# Patient Record
Sex: Female | Born: 1990 | Race: Black or African American | Hispanic: No | Marital: Single | Smoking: Never smoker
Health system: Southern US, Community
[De-identification: ages and names within clinical notes are randomized; demographics above are authoritative.]

## PROBLEM LIST (undated history)

## (undated) ENCOUNTER — Inpatient Hospital Stay (HOSPITAL_COMMUNITY): Payer: Self-pay

## (undated) DIAGNOSIS — R519 Headache, unspecified: Secondary | ICD-10-CM

## (undated) DIAGNOSIS — N83209 Unspecified ovarian cyst, unspecified side: Secondary | ICD-10-CM

## (undated) DIAGNOSIS — N949 Unspecified condition associated with female genital organs and menstrual cycle: Secondary | ICD-10-CM

## (undated) DIAGNOSIS — A749 Chlamydial infection, unspecified: Secondary | ICD-10-CM

## (undated) DIAGNOSIS — R51 Headache: Secondary | ICD-10-CM

## (undated) HISTORY — PX: LIPOSUCTION AUOLOGOUS FAT TRANSFER TO BUTTOCKS: SHX6685

## (undated) HISTORY — PX: DILATION AND CURETTAGE OF UTERUS: SHX78

---
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
## 2011-03-08 ENCOUNTER — Encounter: Payer: Self-pay | Admitting: Obstetrics and Gynecology

## 2011-03-08 ENCOUNTER — Other Ambulatory Visit: Payer: Self-pay

## 2011-03-17 ENCOUNTER — Encounter: Payer: Self-pay | Admitting: Obstetrics and Gynecology

## 2011-03-25 ENCOUNTER — Encounter: Payer: Medicaid Other | Admitting: Obstetrics and Gynecology

## 2011-03-25 ENCOUNTER — Other Ambulatory Visit (INDEPENDENT_AMBULATORY_CARE_PROVIDER_SITE_OTHER): Payer: Medicaid Other

## 2011-03-25 DIAGNOSIS — N949 Unspecified condition associated with female genital organs and menstrual cycle: Secondary | ICD-10-CM

## 2011-03-25 DIAGNOSIS — N920 Excessive and frequent menstruation with regular cycle: Secondary | ICD-10-CM

## 2011-05-17 ENCOUNTER — Telehealth: Payer: Self-pay | Admitting: Obstetrics and Gynecology

## 2011-05-18 ENCOUNTER — Other Ambulatory Visit (INDEPENDENT_AMBULATORY_CARE_PROVIDER_SITE_OTHER): Payer: Medicaid Other

## 2011-05-18 DIAGNOSIS — N912 Amenorrhea, unspecified: Secondary | ICD-10-CM

## 2011-05-18 LAB — POCT URINE PREGNANCY: Preg Test, Ur: NEGATIVE

## 2011-10-24 ENCOUNTER — Inpatient Hospital Stay (HOSPITAL_COMMUNITY)
Admission: AD | Admit: 2011-10-24 | Discharge: 2011-10-24 | Disposition: A | Discharge status: Home / Self Care | Payer: Medicaid Other | Source: Ambulatory Visit | Attending: Family Medicine | Admitting: Family Medicine

## 2011-10-24 ENCOUNTER — Encounter (HOSPITAL_COMMUNITY): Payer: Self-pay | Admitting: Obstetrics and Gynecology

## 2011-10-24 DIAGNOSIS — B373 Candidiasis of vulva and vagina: Secondary | ICD-10-CM

## 2011-10-24 DIAGNOSIS — B3731 Acute candidiasis of vulva and vagina: Secondary | ICD-10-CM | POA: Insufficient documentation

## 2011-10-24 DIAGNOSIS — N949 Unspecified condition associated with female genital organs and menstrual cycle: Secondary | ICD-10-CM | POA: Insufficient documentation

## 2011-10-24 HISTORY — DX: Chlamydial infection, unspecified: A74.9

## 2011-10-24 LAB — WET PREP, GENITAL

## 2011-10-24 MED ORDER — FLUCONAZOLE 150 MG PO TABS: 150.0000 mg | ORAL_TABLET | Freq: Once | ORAL | Status: DC

## 2011-10-24 MED ORDER — FLUCONAZOLE 150 MG PO TABS
150.0000 mg | ORAL_TABLET | Freq: Once | ORAL | Status: AC
  Administered 2011-10-24: 150 mg via ORAL
  Filled 2011-10-24: qty 1

## 2011-10-24 NOTE — MAU Provider Note (Signed)
  History     CSN: [id number]  Arrival date and time: 10/24/11 1027   First Provider Initiated Contact with Patient 10/24/11 1126      No chief complaint on file.  HPI  Pt is not pregnant and was diagnosed  With Chlamydia after being seen at PCP on [address]   She was initially given a prescription for Amoxicillin and when Chlamdyia cultre came back positive, she  was given a prescription for Doxycycline as well  As Flagyl.  She has had some vaginal itching with thick white vaginal discharge.  Past Medical History  Diagnosis Date  . Chlamydia     currently being treated    Past Surgical History  Procedure Date  . No past surgeries     Family History  Problem Relation Age of Onset  . Cancer Maternal Grandmother   . Diabetes Maternal Grandmother   . Heart disease Maternal Grandmother   . Cancer Maternal Grandfather     History  Substance Use Topics  . Smoking status: Never Smoker   . Smokeless tobacco: Not on file  . Alcohol Use: No    Allergies:  Allergies  Allergen Reactions  . Cefaclor Rash    Prescriptions prior to admission  Medication Sig Dispense Refill  . doxycycline (VIBRAMYCIN) 100 MG capsule Take 100 mg by mouth 2 (two) times daily. Patient on day 4 10-24-11 out of 7 days      . metroNIDAZOLE (FLAGYL) 500 MG tablet Take 500 mg by mouth 2 (two) times daily. Patient on day 3 10-24-11 out of 7      . norgestimate-ethinyl estradiol (ORTHO-CYCLEN,SPRINTEC,PREVIFEM) 0.25-35 MG-MCG tablet Take 1 tablet by mouth daily.        ROS Physical Exam   Height 5' 3.75" (1.619 m), weight 72.485 kg (159 lb 12.8 oz), last menstrual period 09/27/2011, not currently breastfeeding.  Physical Exam  Constitutional: She is oriented to person, place, and time. She appears well-developed and well-nourished.  HENT:  Head: Normocephalic.  Eyes: Pupils are equal, round, and reactive to light.  Neck: Normal range of motion. Neck supple.  Cardiovascular: Normal rate.     Respiratory: Effort normal.  GI: She exhibits no distension. There is no tenderness. There is no rebound and no guarding.  Genitourinary:       Vaginal mucosa reddened with small amount of white creamy discharge in vault; cervix clean, NT; uterus retroverted NSSC ,NT; adnexa without palpable enlargement or tenderness  Musculoskeletal: Normal range of motion.  Neurological: She is alert and oriented to person, place, and time.  Skin: Skin is warm and dry.  Psychiatric: She has a normal mood and affect.    MAU Course  Procedures Diflucan given in MAU  Assessment and Plan  Yeast vaginitis- Diflucan prescription to take if still having symptoms in 3 days  Jaena Brocato 10/24/2011, 11:27 AM

## 2011-10-24 NOTE — MAU Note (Signed)
"  I was tested for STIs 2 weeks ago at my doctor's office.  I took an antibiotic for BV and completes that.  My doctor then called me to inform me of (+) Chlamydia.  She called in a Rx for Chlamydia on Wednesday (10/16).  I started having vaginal irritation yesterday.  The irritation is like burning and itching that had me up tossing and turning all night long.  My partner hasn't been treated, because of insurance reasons.  I haven't slept with him since I found out I had it.  He has been my partner for 3 years and I haven't been with anyone besides him."

## 2011-10-25 NOTE — MAU Provider Note (Signed)
Chart reviewed and agree with management and plan.  

## 2011-11-30 ENCOUNTER — Ambulatory Visit (INDEPENDENT_AMBULATORY_CARE_PROVIDER_SITE_OTHER): Payer: Medicaid Other | Admitting: Obstetrics and Gynecology

## 2011-11-30 ENCOUNTER — Encounter: Payer: Self-pay | Admitting: Obstetrics and Gynecology

## 2011-11-30 DIAGNOSIS — R87811 Vaginal high risk human papillomavirus (HPV) DNA test positive: Secondary | ICD-10-CM

## 2011-11-30 DIAGNOSIS — IMO0002 Reserved for concepts with insufficient information to code with codable children: Secondary | ICD-10-CM

## 2011-11-30 DIAGNOSIS — R87619 Unspecified abnormal cytological findings in specimens from cervix uteri: Secondary | ICD-10-CM

## 2011-11-30 LAB — POCT URINE PREGNANCY: Preg Test, Ur: NEGATIVE

## 2011-11-30 NOTE — Progress Notes (Signed)
Here secondary to ASCUS pap with +HRHPV  Filed Vitals:   11/30/11 1440  BP: 98/60   A/P Reviewed pap results Options and recs discussed PT only 21 Will repeat pap in April 2014

## 2012-03-28 ENCOUNTER — Inpatient Hospital Stay (HOSPITAL_COMMUNITY)
Admission: AD | Admit: 2012-03-28 | Discharge: 2012-03-29 | Disposition: A | Discharge status: Home / Self Care | Payer: Medicaid Other | Source: Ambulatory Visit | Attending: Family Medicine | Admitting: Family Medicine

## 2012-03-28 ENCOUNTER — Encounter (HOSPITAL_COMMUNITY): Payer: Self-pay

## 2012-03-28 DIAGNOSIS — K5289 Other specified noninfective gastroenteritis and colitis: Secondary | ICD-10-CM

## 2012-03-28 DIAGNOSIS — R112 Nausea with vomiting, unspecified: Secondary | ICD-10-CM | POA: Insufficient documentation

## 2012-03-28 DIAGNOSIS — R109 Unspecified abdominal pain: Secondary | ICD-10-CM | POA: Insufficient documentation

## 2012-03-28 DIAGNOSIS — A088 Other specified intestinal infections: Secondary | ICD-10-CM | POA: Insufficient documentation

## 2012-03-28 DIAGNOSIS — K529 Noninfective gastroenteritis and colitis, unspecified: Secondary | ICD-10-CM

## 2012-03-28 LAB — URINALYSIS, ROUTINE W REFLEX MICROSCOPIC
Ketones, ur: 40 mg/dL — AB
Leukocytes, UA: NEGATIVE
Nitrite: NEGATIVE
Protein, ur: NEGATIVE mg/dL
pH: 6 (ref 5.0–8.0)

## 2012-03-28 MED ORDER — ONDANSETRON HCL 4 MG/2ML IJ SOLN
4.0000 mg | Freq: Once | INTRAMUSCULAR | Status: AC
  Administered 2012-03-28: 4 mg via INTRAVENOUS
  Filled 2012-03-28: qty 2

## 2012-03-28 MED ORDER — LACTATED RINGERS IV BOLUS (SEPSIS)
1000.0000 mL | Freq: Once | INTRAVENOUS | Status: AC
  Administered 2012-03-28: 1000 mL via INTRAVENOUS

## 2012-03-28 NOTE — MAU Note (Signed)
Pt reports she has had vomiting for the last few days, mid abd pain. lmp 2 weeks ago

## 2012-03-29 MED ORDER — PROMETHAZINE HCL 25 MG PO TABS: 25.0000 mg | ORAL_TABLET | Freq: Four times a day (QID) | ORAL | Status: DC | PRN

## 2012-03-29 MED ORDER — ONDANSETRON HCL 4 MG PO TABS: 4.0000 mg | ORAL_TABLET | Freq: Four times a day (QID) | ORAL | Status: DC

## 2012-03-29 MED ORDER — ONDANSETRON 8 MG PO TBDP
8.0000 mg | ORAL_TABLET | Freq: Once | ORAL | Status: AC
  Administered 2012-03-29: 8 mg via ORAL
  Filled 2012-03-29: qty 1

## 2012-03-29 NOTE — MAU Provider Note (Signed)
Chart reviewed and agree with management and plan.  

## 2012-03-29 NOTE — MAU Provider Note (Signed)
History     CSN: [id number]  Arrival date and time: 03/28/12 2112   First Provider Initiated Contact with Patient 03/28/12 2239      Chief Complaint  Patient presents with  . Emesis  . Abdominal Pain   HPI Ms. Amber Good is a 22 y.o. G1P0101 who present to MAU today with complaint of N/V and abdominal pain since this afternoon. The patient states that she had [employer] last night and today and started to feel nauseous around 4 pm. She is having upper abdominal pain associated with the nausea. She denies fever and has no GYN concerns. LMP 03/14/12.   OB History   Grav Para Term Preterm Abortions TAB SAB Ect Mult Living   1 1  1      1       Past Medical History  Diagnosis Date  . Chlamydia     currently being treated    Past Surgical History  Procedure Laterality Date  . No past surgeries      Family History  Problem Relation Age of Onset  . Cancer Maternal Grandmother   . Diabetes Maternal Grandmother   . Heart disease Maternal Grandmother   . Cancer Maternal Grandfather     History  Substance Use Topics  . Smoking status: Never Smoker   . Smokeless tobacco: Not on file  . Alcohol Use: No    Allergies:  Allergies  Allergen Reactions  . Cefaclor Rash    No prescriptions prior to admission    Review of Systems  Constitutional: Negative for fever and malaise/fatigue.  Gastrointestinal: Positive for nausea, vomiting and abdominal pain. Negative for diarrhea and constipation.  Genitourinary: Negative for dysuria, urgency and frequency.  Musculoskeletal: Negative for back pain.  Neurological: Negative for dizziness.   Physical Exam   Blood pressure 101/50, pulse 97, temperature 98.6 F (37 C), temperature source Oral, resp. rate 18, height 5\' 3"  (1.6 m), weight 163 lb (73.936 kg), last menstrual period 03/14/2012, SpO2 100.00%.  Physical Exam  Constitutional: She is oriented to person, place, and time. She appears well-developed and  well-nourished. No distress.  HENT:  Head: Normocephalic and atraumatic.  Cardiovascular: Normal rate, regular rhythm and normal heart sounds.   Respiratory: Effort normal and breath sounds normal. No respiratory distress.  GI: Soft. Bowel sounds are normal. She exhibits no distension and no mass. There is tenderness (diffuse mild tenderness to palpation). There is no rebound and no guarding.  Neurological: She is alert and oriented to person, place, and time.  Skin: Skin is warm and dry. No erythema.  Psychiatric: She has a normal mood and affect.   Results for orders placed during the hospital encounter of 03/28/12 (from the past 24 hour(s))  URINALYSIS, ROUTINE W REFLEX MICROSCOPIC     Status: Abnormal   Collection Time    03/28/12  9:40 PM      Result Value Range   Color, Urine YELLOW  YELLOW   APPearance CLEAR  CLEAR   Specific Gravity, Urine >1.030 (*) 1.005 - 1.030   pH 6.0  5.0 - 8.0   Glucose, UA NEGATIVE  NEGATIVE mg/dL   Hgb urine dipstick NEGATIVE  NEGATIVE   Bilirubin Urine NEGATIVE  NEGATIVE   Ketones, ur 40 (*) NEGATIVE mg/dL   Protein, ur NEGATIVE  NEGATIVE mg/dL   Urobilinogen, UA 0.2  0.0 - 1.0 mg/dL   Nitrite NEGATIVE  NEGATIVE   Leukocytes, UA NEGATIVE  NEGATIVE  POCT PREGNANCY, URINE  Status: None   Collection Time    03/28/12  9:52 PM      Result Value Range   Preg Test, Ur NEGATIVE  NEGATIVE    MAU Course  Procedures None  MDM Patient actively vomiting upon arrival. UA shows signs of dehydration.  2 L LR IV given with 4 mg Zofran in first bag. Patient reports improvement of symptoms.  Just prior to discharge patient states that she is starting to have some nausea again. ODT Zofran given as patient will likely not be able to fill Rx before tomorrow morning.   Assessment and Plan  A: Viral Gastroenteritis  P: Discharge home Rx for phenergan and zofran sent to patient's pharmacy Encouraged increased PO hydration as tolerated Patient encouraged  to follow-up with PCP if symptoms do not improve Patient may return to MAU as needed or if her symptoms were to change or worsen  Freddi Starr, PA-C  03/29/2012, 3:21 AM

## 2013-07-26 ENCOUNTER — Inpatient Hospital Stay (HOSPITAL_COMMUNITY): Payer: Medicaid Other

## 2013-07-26 ENCOUNTER — Inpatient Hospital Stay (HOSPITAL_COMMUNITY)
Admission: AD | Admit: 2013-07-26 | Discharge: 2013-07-26 | Disposition: A | Discharge status: Home / Self Care | Payer: Medicaid Other | Source: Ambulatory Visit | Attending: Obstetrics & Gynecology | Admitting: Obstetrics & Gynecology

## 2013-07-26 ENCOUNTER — Encounter (HOSPITAL_COMMUNITY): Payer: Self-pay | Admitting: *Deleted

## 2013-07-26 DIAGNOSIS — M549 Dorsalgia, unspecified: Secondary | ICD-10-CM | POA: Insufficient documentation

## 2013-07-26 DIAGNOSIS — O26899 Other specified pregnancy related conditions, unspecified trimester: Secondary | ICD-10-CM

## 2013-07-26 DIAGNOSIS — Z3201 Encounter for pregnancy test, result positive: Secondary | ICD-10-CM | POA: Insufficient documentation

## 2013-07-26 DIAGNOSIS — N912 Amenorrhea, unspecified: Secondary | ICD-10-CM | POA: Insufficient documentation

## 2013-07-26 DIAGNOSIS — R109 Unspecified abdominal pain: Secondary | ICD-10-CM | POA: Diagnosis not present

## 2013-07-26 DIAGNOSIS — O219 Vomiting of pregnancy, unspecified: Secondary | ICD-10-CM

## 2013-07-26 LAB — URINALYSIS, ROUTINE W REFLEX MICROSCOPIC
Bilirubin Urine: NEGATIVE
Glucose, UA: NEGATIVE mg/dL
Hgb urine dipstick: NEGATIVE
Ketones, ur: NEGATIVE mg/dL
Leukocytes, UA: NEGATIVE
Nitrite: NEGATIVE
PROTEIN: NEGATIVE mg/dL
Specific Gravity, Urine: 1.025 (ref 1.005–1.030)
UROBILINOGEN UA: 0.2 mg/dL (ref 0.0–1.0)
pH: 6 (ref 5.0–8.0)

## 2013-07-26 LAB — CBC
HEMATOCRIT: 38.5 % (ref 36.0–46.0)
Hemoglobin: 13.4 g/dL (ref 12.0–15.0)
MCH: 31 pg (ref 26.0–34.0)
MCHC: 34.8 g/dL (ref 30.0–36.0)
MCV: 89.1 fL (ref 78.0–100.0)
Platelets: 181 10*3/uL (ref 150–400)
RBC: 4.32 MIL/uL (ref 3.87–5.11)
RDW: 12.7 % (ref 11.5–15.5)
WBC: 5.5 10*3/uL (ref 4.0–10.5)

## 2013-07-26 LAB — POCT PREGNANCY, URINE: Preg Test, Ur: POSITIVE — AB

## 2013-07-26 LAB — WET PREP, GENITAL
Clue Cells Wet Prep HPF POC: NONE SEEN
Trich, Wet Prep: NONE SEEN
Yeast Wet Prep HPF POC: NONE SEEN

## 2013-07-26 LAB — HCG, QUANTITATIVE, PREGNANCY: hCG, Beta Chain, Quant, S: 36 m[IU]/mL — ABNORMAL HIGH (ref <5)

## 2013-07-26 MED ORDER — METOCLOPRAMIDE HCL 10 MG PO TABS: 10.0000 mg | ORAL_TABLET | Freq: Three times a day (TID) | ORAL | Status: DC

## 2013-07-26 NOTE — MAU Note (Signed)
Chief Complaint: Possible Pregnancy, Abdominal Pain, Back Pain and Nausea    SUBJECTIVE HPI: Amber Good is a 23 y.o. G2P0101 at [redacted]w[redacted]d by LMP who presents with diffuse moderate intensity lower abdominal pain with associated low back pain and nausea x 2 weeks. Initially pain was mild and intermittent presenting like her typical menstrual cramping but incrementally progressed to moderate pain with associated back pain and persistent nausea. Patient endorses a regular, consistent cycle and became concerned when menstruation did not occur in the context of her current symptoms. She is sexually active with one female  partner and does not use contraception but states that pregnancy is unintended at this time. Patient notes 1 positive home pregnancy test that was follwed by 4 negative tests. Patient was unable to sleep last night due to pain which prompted her visit today.   Patient denies; fever, chills, vomiting, dysuria, vaginal discomfort. PMH is negative for ovarian cysts, ovarian torsion, nephrolithiasis, PID, untreated STI's, prior ectopic pregnancy, abdominal surgery.   Past Medical History  Diagnosis Date  . Chlamydia     Recently resolved   OB History  Gravida Para Term Preterm AB SAB TAB Ectopic Multiple Living  2 1  1      1     # Outcome Date GA Lbr Len/2nd Weight Sex Delivery Anes PTL Lv  2 CUR           1 PRE 10/11/10 [redacted]w[redacted]d 08:26 / 00:10 3 lb 5 oz (1.501 kg) M SVD EPI  Y     Past Surgical History  Procedure Laterality Date  . No past surgeries     History   Social History  . Marital Status: Single    Spouse Name: N/A    Number of Children: N/A  . Years of Education: N/A   Occupational History  . Not on file.   Social History Main Topics  . Smoking status: Never Smoker   . Smokeless tobacco: Never Used  . Alcohol Use: No  . Drug Use: No  . Sexual Activity: Yes    Birth Control/ Protection: None   Other Topics Concern  . Not on file   Social History  Narrative  . No narrative on file   No current facility-administered medications on file prior to encounter.   No current outpatient prescriptions on file prior to encounter.   Allergies  Allergen Reactions  . Cefaclor Rash    ROS: Pertinent items in HPI  OBJECTIVE Blood pressure 109/58, pulse 63, temperature 99.2 F (37.3 C), temperature source Oral, resp. rate 18, height 5\' 4"  (1.626 m), weight 175 lb 3.2 oz (79.47 kg), last menstrual period 06/19/2013, SpO2 100.00%. GENERAL: Well-developed, well-nourished female in no acute distress.  HEENT: Normocephalic, atraumatic HEART: normal rate and rhythm, no murmurs, rubs, gallops RESP: normal effort, clear to auscultation bilateral. ABDOMEN: Soft, non-tender, normoactive bowel sounds EXTREMITIES: Nontender, no edema, 2+ equal distal pulses NEURO: Alert and oriented SPECULUM EXAM: NEFG, physiologic discharge, no blood noted, cervix clean BIMANUAL: cervix ; uterus normal size, no adnexal tenderness or masses  LAB RESULTS    URINALYSIS, ROUTINE W REFLEX MICROSCOPIC     Status: None   Collection Time    07/26/13 10:55 AM      Result Value Ref Range   Color, Urine YELLOW  YELLOW   APPearance CLEAR  CLEAR   Specific Gravity, Urine 1.025  1.005 - 1.030   pH 6.0  5.0 - 8.0   Glucose, UA NEGATIVE  NEGATIVE mg/dL  Hgb urine dipstick NEGATIVE  NEGATIVE   Bilirubin Urine NEGATIVE  NEGATIVE   Ketones, ur NEGATIVE  NEGATIVE mg/dL   Protein, ur NEGATIVE  NEGATIVE mg/dL   Urobilinogen, UA 0.2  0.0 - 1.0 mg/dL   Nitrite NEGATIVE  NEGATIVE   Leukocytes, UA NEGATIVE  NEGATIVE   Comment: MICROSCOPIC NOT DONE ON URINES WITH NEGATIVE PROTEIN, BLOOD, LEUKOCYTES, NITRITE, OR GLUCOSE <1000 mg/dL.  POCT PREGNANCY, URINE     Status: Abnormal   Collection Time    07/26/13 11:04 AM      Result Value Ref Range   Preg Test, Ur POSITIVE (*) NEGATIVE   Comment:            THE SENSITIVITY OF THIS     METHODOLOGY IS >24 mIU/mL  CBC     Status: None    Collection Time    07/26/13 12:34 PM      Result Value Ref Range   WBC 5.5  4.0 - 10.5 K/uL   RBC 4.32  3.87 - 5.11 MIL/uL   Hemoglobin 13.4  12.0 - 15.0 g/dL   HCT 91.4  78.2 - 95.6 %   MCV 89.1  78.0 - 100.0 fL   MCH 31.0  26.0 - 34.0 pg   MCHC 34.8  30.0 - 36.0 g/dL   RDW 21.3  08.6 - 57.8 %   Platelets 181  150 - 400 K/uL  HCG, QUANTITATIVE, PREGNANCY     Status: Abnormal   Collection Time    07/26/13 12:35 PM      Result Value Ref Range   hCG, Beta Chain, Quant, S 36 (*) <5 mIU/mL   Comment:              GEST. AGE      CONC.  (mIU/mL)       <=1 WEEK        5 - 50         2 WEEKS       50 - 500         3 WEEKS       100 - 10,000         4 WEEKS     1,000 - 30,000         5 WEEKS     3,500 - 115,000       6-8 WEEKS     12,000 - 270,000        12 WEEKS     15,000 - 220,000                FEMALE AND NON-PREGNANT FEMALE:         LESS THAN 5 mIU/mL  WET PREP, GENITAL     Status: Abnormal   Collection Time    07/26/13  2:00 PM      Result Value Ref Range   Yeast Wet Prep HPF POC NONE SEEN  NONE SEEN   Trich, Wet Prep NONE SEEN  NONE SEEN   Clue Cells Wet Prep HPF POC NONE SEEN  NONE SEEN   WBC, Wet Prep HPF POC FEW (*) NONE SEEN   Comment: FEW BACTERIA SEEN   IMAGING  CLINICAL DATA: Pelvic and back pain. Gestational age by LMP of 5  weeks 2 days.  EXAM:  OBSTETRIC <14 WK [place] AND TRANSVAGINAL OB US  TECHNIQUE:  Both transabdominal and transvaginal ultrasound examinations were  performed for complete evaluation of the gestation as well as the  maternal uterus, adnexal regions,  and pelvic cul-de-sac.  Transvaginal technique was performed to assess early pregnancy.  COMPARISON: None.  FINDINGS:  No intrauterine gestational sac visualized in endometrial cavity.  Endometrial thickness measures approximately 19 mm transvaginally.  No fibroids identified.  Both ovaries are normal in appearance. No adnexal mass or free fluid  identified.  IMPRESSION:  No IUP or adnexal  mass visualized. Differential diagnosis includes  recent spontaneous abortion, IUP too early to visualize, and occult  ectopic pregnancy. Recommend close follow up of quantitative B-HCG  levels, and follow up US as clinically warranted.     MAU COURSE  ASSESSMENT Possible IUP vs Ectopy  Unable to confirm with transvaginal US and b-HCG   PLAN RX Reglan 10 mg TID with meals F/U with provider for repeat b-HCG in 48hrs (Saturday) Ectopic precautions reviewed.  Lyna PoserJeremy M Kino SpringsNicoloff, Student-PA 07/26/2013  2:03 PM  I examined pt and agree with documentation above and PA-S plan of care. Eino FarberWalidah Kennith GainN Karim, CNM

## 2013-07-26 NOTE — MAU Note (Signed)
Patient states she has had 5 pregnancy test at home, the first about one week ago was positive, then 4 negative. Has missed her period. Has some nausea, no vomiting. Having abdominal cramping and back pain.

## 2013-07-26 NOTE — Discharge Instructions (Signed)

## 2013-07-26 NOTE — MAU Note (Signed)
Pt states that her last period was June 16th, has taken pregnancy 5 tests of which one was positive. She has constant back and stomach pain. Pt has nausea but denies vomiting.

## 2013-07-27 LAB — GC/CHLAMYDIA PROBE AMP
CT PROBE, AMP APTIMA: NEGATIVE
GC PROBE AMP APTIMA: NEGATIVE

## 2013-08-11 ENCOUNTER — Encounter (HOSPITAL_COMMUNITY): Payer: Self-pay | Admitting: *Deleted

## 2013-08-11 ENCOUNTER — Inpatient Hospital Stay (HOSPITAL_COMMUNITY): Payer: Medicaid Other

## 2013-08-11 ENCOUNTER — Inpatient Hospital Stay (HOSPITAL_COMMUNITY)
Admission: AD | Admit: 2013-08-11 | Discharge: 2013-08-11 | Disposition: A | Discharge status: Home / Self Care | Payer: Medicaid Other | Source: Ambulatory Visit | Attending: Obstetrics and Gynecology | Admitting: Obstetrics and Gynecology

## 2013-08-11 DIAGNOSIS — O209 Hemorrhage in early pregnancy, unspecified: Secondary | ICD-10-CM | POA: Diagnosis present

## 2013-08-11 LAB — CBC
HCT: 37.2 % (ref 36.0–46.0)
Hemoglobin: 12.8 g/dL (ref 12.0–15.0)
MCH: 30.5 pg (ref 26.0–34.0)
MCHC: 34.4 g/dL (ref 30.0–36.0)
MCV: 88.8 fL (ref 78.0–100.0)
PLATELETS: 195 10*3/uL (ref 150–400)
RBC: 4.19 MIL/uL (ref 3.87–5.11)
RDW: 12.8 % (ref 11.5–15.5)
WBC: 5.9 10*3/uL (ref 4.0–10.5)

## 2013-08-11 LAB — HCG, QUANTITATIVE, PREGNANCY: HCG, BETA CHAIN, QUANT, S: 9097 m[IU]/mL — AB (ref <5)

## 2013-08-11 NOTE — MAU Note (Signed)
Pt states here for bleeding. Has had bleeding however this am woke up and noted dark red blood, no clots. Was told to come to MAU for further eval.

## 2013-08-11 NOTE — MAU Provider Note (Signed)
History    23 yo G2P0010 at approx 7 weeks by LMP presented after calling with small amount of bleeding this am.  Denies pain or recent IC.  Seen in MAU on 07/26/13 for abdominal pain, nausea, ? Pregnancy.  QHCG 36, with negative cultures.  Has a single female partner.  Patient Active Problem List   Diagnosis Date Noted  . 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 Preterm delivery 10/13/2010  . Preterm labor 10/11/2010  . Back pain complicating pregnancy 10/06/2010  Delivered at 32 weeks in 2012.  Chief Complaint  Patient presents with  . Vaginal Bleeding   HPI:  As above  OB History   Grav Para Term Preterm Abortions TAB SAB Ect Mult Living   2 1  1      1       Past Medical History  Diagnosis Date  . Preterm labor   . Chlamydia     Past Surgical History  Procedure Laterality Date  . No past surgeries      Family History  Problem Relation Age of Onset  . Cancer Maternal Grandmother   . Diabetes Maternal Grandmother   . Heart disease Maternal Grandmother   . Cancer Maternal Grandfather     History  Substance Use Topics  . Smoking status: Never Smoker   . Smokeless tobacco: Never Used  . Alcohol Use: No    Allergies:  Allergies  Allergen Reactions  . Cefaclor Rash    Prescriptions prior to admission  Medication Sig Dispense Refill  . metoCLOPramide (REGLAN) 10 MG tablet Take 1 tablet (10 mg total) by mouth 3 (three) times daily with meals.  90 tablet  1    ROS:  Vaginal bleeding. Physical Exam   Blood pressure 113/71, pulse 76, temperature 97.9 F (36.6 C), temperature source Oral, resp. rate 18, height 5' 3.75" (1.619 m), weight 174 lb 8 oz (79.153 kg), last menstrual period 06/19/2013.  Physical Exam In NAD Chest clear Heart RRR without murmur Abdomen soft, NT Pelvic--small amount dark blood in vault, cervix closed, long, NT Ext WNL    ED Course  Assessment: 1st trimester bleeding B+  Plan: QHCG, CBC US prn based on QHCG  level Reviewed risk of SAB with patient and mother.   Nigel BridgemanLATHAM, Amber Good CNM, MSN 08/11/2013 7:58 AM   Addendum:  Returned from US: IMPRESSION:  Early gestation now visualized in the uterus. Estimated gestational  age by ultrasound is 5 weeks and 4 days. Fetal cardiac activity is  not yet detectable by ultrasound. Irregular shaped of the  gestational sac present which should be followed.  Results for orders placed during the hospital encounter of 08/11/13 (from the past 24 hour(s))  HCG, QUANTITATIVE, PREGNANCY     Status: Abnormal   Collection Time    08/11/13  7:40 AM      Result Value Ref Range   hCG, Beta Chain, Quant, S 9097 (*) <5 mIU/mL  CBC     Status: None   Collection Time    08/11/13  7:40 AM      Result Value Ref Range   WBC 5.9  4.0 - 10.5 K/uL   RBC 4.19  3.87 - 5.11 MIL/uL   Hemoglobin 12.8  12.0 - 15.0 g/dL   HCT 62.137.2  30.836.0 - 65.746.0 %   MCV 88.8  78.0 - 100.0 fL   MCH 30.5  26.0 - 34.0 pg   MCHC 34.4  30.0 - 36.0 g/dL   RDW 12.8  11.5 - 15.5 %   Platelets 195  150 - 400 K/uL   Reviewed QHCG and US findings with patient--discussed threatened miscarriage dx. Will repeat QHCG on Monday to determine if going down. Note to office for arrangements. Consider [place] later this week if values have plateaued or risen. Bleeding precautions reviewed. Note for work today, if patient elects not to go.  Nigel Bridgeman, CNM 08/13/13 9:50am

## 2013-08-11 NOTE — Discharge Instructions (Signed)

## 2013-09-11 ENCOUNTER — Other Ambulatory Visit (HOSPITAL_COMMUNITY): Payer: Self-pay | Admitting: Family Medicine

## 2013-09-11 ENCOUNTER — Inpatient Hospital Stay (HOSPITAL_COMMUNITY): Payer: Medicaid Other

## 2013-09-11 ENCOUNTER — Inpatient Hospital Stay (HOSPITAL_COMMUNITY)
Admission: AD | Admit: 2013-09-11 | Discharge: 2013-09-11 | Disposition: A | Discharge status: Home / Self Care | Payer: Medicaid Other | Source: Ambulatory Visit | Attending: Obstetrics and Gynecology | Admitting: Obstetrics and Gynecology

## 2013-09-11 ENCOUNTER — Encounter (HOSPITAL_COMMUNITY): Payer: Self-pay | Admitting: *Deleted

## 2013-09-11 DIAGNOSIS — O200 Threatened abortion: Secondary | ICD-10-CM | POA: Insufficient documentation

## 2013-09-11 DIAGNOSIS — IMO0002 Reserved for concepts with insufficient information to code with codable children: Secondary | ICD-10-CM

## 2013-09-11 DIAGNOSIS — O3680X1 Pregnancy with inconclusive fetal viability, fetus 1: Secondary | ICD-10-CM

## 2013-09-11 DIAGNOSIS — O209 Hemorrhage in early pregnancy, unspecified: Secondary | ICD-10-CM | POA: Diagnosis present

## 2013-09-11 LAB — CBC
HCT: 38.3 % (ref 36.0–46.0)
Hemoglobin: 13.4 g/dL (ref 12.0–15.0)
MCH: 30.8 pg (ref 26.0–34.0)
MCHC: 35 g/dL (ref 30.0–36.0)
MCV: 88 fL (ref 78.0–100.0)
Platelets: 204 K/uL (ref 150–400)
RBC: 4.35 MIL/uL (ref 3.87–5.11)
RDW: 12.4 % (ref 11.5–15.5)
WBC: 6.2 K/uL (ref 4.0–10.5)

## 2013-09-11 LAB — HCG, QUANTITATIVE, PREGNANCY: hCG, Beta Chain, Quant, S: 20102 m[IU]/mL — ABNORMAL HIGH (ref <5)

## 2013-09-11 NOTE — Discharge Instructions (Signed)
Intrauterine Fetal Demise °About one percent of normal, uncomplicated pregnancies end in fetal death (intrauterine fetal demise, IUFD). It is considered a fetal death when it occurs after the 20th week of pregnancy. It is considered a miscarriage when a fetal death occurs in the first 20 weeks. The mother's health is usually not in danger. Usually, there is nothing that can be done to prevent it. °CAUSES °· Often the cause is unknown. °· Examination of the stillborn fetus after delivery may show an abnormality in the umbilical cord. An exam my also show a problem with the placenta or fetus. These problems may include infections or a variety of birth defects and genetic disorders. °· The pregnancy continues for 42 weeks or later (post term pregnancy). °· Conditions in the mother such as diabetes, high blood pressure, and numerous other medical, physical or poor lifestyle choices (illegal drugs, alcohol, smoking) increase the risk for fetal death. Often, however, risk factors are unknown. °· Multiple pregnancies (twins or more) increase the risk of fetal death. °SYMPTOMS  °· The mother may not notice symptoms in the early stages of pregnancy. Learning what is wrong (diagnosis) is based on: °¨ The loss of baby's heart sounds. °¨ The lack of increasing belly (abdominal) growth. °¨ Ultrasound studies which suggest death of the fetus. °· In later stages of pregnancy, a woman may be aware of changes in the fetal movement (kicks), or that the movement has stopped. °RELATED COMPLICATIONS °· Disseminated intravascular coagulation (DIC) is a problem with blood clotting. This can result in severe bleeding and rarely develops late after fetal death. °· Infection of the products of the pregnancy (fetal materials). °· Increase bleeding from retained fetal parts or placenta. °TREATMENT  °· Treatment should be accomplished within 2 weeks of the discovered fetal death. °· To confirm the fetal death, diagnostic tests are done such  as: °¨ X-rays. °¨ Ultrasound. °¨ Amniotic fluid studies (looking at the fluid in the sac surrounding the baby). °· Most women, on learning that their fetus is dead, prefer early removal of the contents of the womb (uterus). In the first three months of pregnancy (first trimester), this is usually done by D and C or with suction curettage. Suction curettage is a technique used to remove the dead fetus and other tissue of the pregnancy from the uterus. It uses an instrument somewhat like a straw, connected by tubing to a machine, that your caregiver uses to suck out the dead contents of the uterus. NOTE: Suction curettage may be done in the second and third trimester after delivery of the dead fetus only to make sure there is no placental tissue left in the uterus but not to suction out the fetus. °· In the second trimester, treatment is more frequently accomplished with high doses of a drug, prostaglandin E (Prostin) suppositories or in combination with laminaria (as specialized seaweed product that absorbs moisture and expands to gradually stretch and open the cervix). Prostin (T) causes labor to start. °· In the third trimester, it may be accomplished with laminaria and misoprostol vaginal suppositories to induce labor. It may also be done with the drugs intravenous oxytocin plus prostaglandin E. °· If there was an infection involved with the fetal death, you will be given an antibiotic. You will be given Rho-gam if you are Rh negative and the baby is Rh positive (a vaccine to prevent Rh problems with a future pregnancy). An additional treatment option is to wait for spontaneous labor, which usually occurs within 2   weeks, but may be longer. This is called expectant therapy. °· Following removal of the products of the pregnancy, the stillborn fetus is usually examined by a specialist (pathologist) to determine if problems are present that may reoccur in another pregnancy. This can help plan future pregnancies. That  planning will also include treatment which will best guarantee a good outcome in future pregnancies. °· Your caregiver can also help you deal with feelings of loss, guilt, loneliness, anxiety, and hostility. Family and friends can be helpful. If severe grief lasts longer than several months, professional counseling may be helpful. Joining a grief support group may be useful. °· Any medicines prescribed will depend on the type of treatment received. °Other problems can be cared for with your caregivers. There may be discussions on whether or not to see, touch or photograph the infant, whether to name the infant, what to do with the remains (burial or cremation), and holding religious services. °HOME CARE INSTRUCTIONS  °· Restrictions are usually not necessary unless associated with the delivery choice. °· Sexual intercourse should be avoided for 4 to 6 weeks. Starting another pregnancy should be delayed several months, or as suggested by your caregiver. °· Do not use tampons or douche. °· Only take over-the-counter or prescription medicines for pain, discomfort, or fever as directed by your caregiver. Do not take aspirin it can cause you to bleed. Call your caregiver for a prescription for stronger pain medication if you need it. °· No special diet is necessary unless you have diabetes or other medical problems that require a special diet. °· Take showers instead of baths until your caregiver tells you it is okay. °· Ask your caregiver when you can return to driving and to your everyday activities. °· Make an appointment with your caregiver for follow up care. °PREVENTION  °· Eliminate any of the causes, if possible, that were found after evaluating the fetus. °· Control any medical problems you may have before or during the pregnancy. °· Avoid illegal drugs, alcohol and smoking. °· Maintain good prenatal care and follow your caregiver's treatment and advice. °· Report any concerns or unusual changes you notice  during your pregnancy. °· More frequent prenatal visits may be necessary with the next child. °SEEK MEDICAL CARE IF:  °· You develop abnormal vaginal discharge. °· You develop a temperature 102° F (38.9° C) or higher. °· You are getting dizzy and faint. °· You are feeling depressed. °SEEK IMMEDIATE MEDICAL CARE IF:  °During pregnancy: °· You fail to gain weight, or your abdomen is not increasing in size. °· Your unborn child appears to have less movement or stopped moving. Keep your medical conditions under control. °After delivery: °· You have heavy vaginal bleeding. °· You have chills and fever. °· You have chest pain. °· You have shortness of breath. °· You have pain or swelling or redness of your leg. °· Following the death of a fetus, you or a family member need help or emotional support in coping with the grief process. °MAKE SURE YOU:  °· Understand these instructions. °· Will watch your condition. °· Will get help right away if you are not doing well or get worse. °Document Released: 12/21/2004 Document Revised: 03/15/2011 Document Reviewed: 09/15/2006 °[employer]® Patient Information ©2015 [employer]. This information is not intended to replace advice given to you by your health care provider. Make sure you discuss any questions you have with your health care provider. ° °

## 2013-09-11 NOTE — Progress Notes (Signed)
Results for orders placed during the hospital encounter of 09/11/13 (from the past 24 hour(s))  CBC     Status: None   Collection Time    09/11/13  1:10 PM      Result Value Ref Range   WBC 6.2  4.0 - 10.5 K/uL   RBC 4.35  3.87 - 5.11 MIL/uL   Hemoglobin 13.4  12.0 - 15.0 g/dL   HCT 16.1  09.6 - 04.5 %   MCV 88.0  78.0 - 100.0 fL   MCH 30.8  26.0 - 34.0 pg   MCHC 35.0  30.0 - 36.0 g/dL   RDW 40.9  81.1 - 91.4 %   Platelets 204  150 - 400 K/uL  HCG, QUANTITATIVE, PREGNANCY     Status: Abnormal   Collection Time    09/11/13  1:10 PM      Result Value Ref Range   hCG, Beta Chain, Quant, S 20102 (*) <5 mIU/mL    [place] - Intrauterine gestation at approximately 8 weeks 4 days without  evidence of fetal heart tones. Findings meet definitive criteria for  failed pregnancy.   Consulted with Dr. Estanislado Pandy  [place] results reviewed with patient and she appropriately upset. Pt was given the option of expectant management, active management with cytotec or D&E Pt has requested to review her options with her family before making a decision.  Pt DC'd to home with bleeding precautions and instructed to call the office tomorrow with her decision.      Anavictoria Wilk, CNM, MSN 09/11/2013. 2:32 PM

## 2013-09-11 NOTE — MAU Provider Note (Signed)
Amber Good is a 23 y.o. G2P1 at 10.0 weeks per [facility] records, presents to MAU from the office vaginal bleeding.  She report spotting since Saturday which increase to bleeding today.  BV + today, treated.  GC/CL neg 08/08/13. [place] 08/18/13 r/o sub chronic hemorrhage, FHR 109.  Quant on 08/13/13 14391.8.   History     Patient Active Problem List   Diagnosis Date Noted  . 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 Preterm delivery 10/13/2010  . Preterm labor 10/11/2010  . Back pain complicating pregnancy 10/06/2010    No chief complaint on file.  HPI  OB History   Grav Para Term Preterm Abortions TAB SAB Ect Mult Living   Past Medical History  Diagnosis Date  . Preterm labor     Past Surgical History  Procedure Laterality Date  . No past surgeries      Family History  Problem Relation Age of Onset  . Cancer Maternal Grandmother   . Diabetes Maternal Grandmother   . Heart disease Maternal Grandmother   . Cancer Maternal Grandfather     History  Substance Use Topics  . Smoking status: Never Smoker   . Smokeless tobacco: Never Used  . Alcohol Use: No    Allergies:  Allergies  Allergen Reactions  . Cefaclor Rash    No prescriptions prior to admission    ROS See HPI above, all other systems are negative  Physical Exam   Last menstrual period 06/19/2013.  Ext:  WNL ABD: Soft, non tender to palpation, no rebound or guarding SVE: 0/0/-4 in office today   ED Course  Assessment: IUP at  10.0 weeks 1st trimester bleed Threaten abortion  Plan: Labs: CBC, BHCG [place] for viability Consult with Dr. Francisca December Amber Good, CNM, MSN 09/11/2013. 12:57 PM

## 2013-09-11 NOTE — MAU Note (Signed)
Pt sent to MAU from MD office for U/S, is 10 weeks, unable to hear FHT's in office - has been bleeding since Sunday.  States she has been changing 2 panti liners a day.  Denies pain.

## 2013-09-12 ENCOUNTER — Encounter (HOSPITAL_COMMUNITY): Payer: Self-pay | Admitting: *Deleted

## 2013-09-14 ENCOUNTER — Encounter (HOSPITAL_COMMUNITY): Payer: Self-pay | Admitting: Anesthesiology

## 2013-09-14 ENCOUNTER — Ambulatory Visit (HOSPITAL_COMMUNITY): Payer: BC Managed Care – PPO | Admitting: Anesthesiology

## 2013-09-14 ENCOUNTER — Encounter (HOSPITAL_COMMUNITY): Payer: BC Managed Care – PPO | Admitting: Anesthesiology

## 2013-09-14 ENCOUNTER — Encounter (HOSPITAL_COMMUNITY)
Admission: RE | Disposition: A | Discharge status: Home / Self Care | Payer: Self-pay | Source: Ambulatory Visit | Attending: Obstetrics and Gynecology

## 2013-09-14 ENCOUNTER — Ambulatory Visit (HOSPITAL_COMMUNITY)
Admission: RE | Admit: 2013-09-14 | Discharge: 2013-09-14 | Disposition: A | Discharge status: Home / Self Care | Payer: BC Managed Care – PPO | Source: Ambulatory Visit | Attending: Obstetrics and Gynecology | Admitting: Obstetrics and Gynecology

## 2013-09-14 DIAGNOSIS — O021 Missed abortion: Secondary | ICD-10-CM | POA: Diagnosis not present

## 2013-09-14 HISTORY — PX: DILATION AND EVACUATION: SHX1459

## 2013-09-14 LAB — CBC
HEMATOCRIT: 38.4 % (ref 36.0–46.0)
Hemoglobin: 13.7 g/dL (ref 12.0–15.0)
MCH: 31.6 pg (ref 26.0–34.0)
MCHC: 35.7 g/dL (ref 30.0–36.0)
MCV: 88.7 fL (ref 78.0–100.0)
PLATELETS: 187 10*3/uL (ref 150–400)
RBC: 4.33 MIL/uL (ref 3.87–5.11)
RDW: 12.4 % (ref 11.5–15.5)
WBC: 6.3 10*3/uL (ref 4.0–10.5)

## 2013-09-14 SURGERY — DILATION AND EVACUATION, UTERUS
Anesthesia: Monitor Anesthesia Care | Site: Uterus

## 2013-09-14 MED ORDER — ONDANSETRON HCL 4 MG/2ML IJ SOLN
INTRAMUSCULAR | Status: DC | PRN
  Administered 2013-09-14: 4 mg via INTRAVENOUS

## 2013-09-14 MED ORDER — DEXAMETHASONE SODIUM PHOSPHATE 10 MG/ML IJ SOLN
INTRAMUSCULAR | Status: DC | PRN
  Administered 2013-09-14: 4 mg via INTRAVENOUS

## 2013-09-14 MED ORDER — ONDANSETRON HCL 4 MG/2ML IJ SOLN
INTRAMUSCULAR | Status: AC
  Filled 2013-09-14: qty 2

## 2013-09-14 MED ORDER — FENTANYL CITRATE 0.05 MG/ML IJ SOLN
INTRAMUSCULAR | Status: DC | PRN
  Administered 2013-09-14 (×2): 50 ug via INTRAVENOUS

## 2013-09-14 MED ORDER — LIDOCAINE HCL (CARDIAC) 20 MG/ML IV SOLN
INTRAVENOUS | Status: AC
  Filled 2013-09-14: qty 5

## 2013-09-14 MED ORDER — KETOROLAC TROMETHAMINE 30 MG/ML IJ SOLN: 15.0000 mg | Freq: Once | INTRAMUSCULAR | Status: DC | PRN

## 2013-09-14 MED ORDER — MEPERIDINE HCL 25 MG/ML IJ SOLN: 6.2500 mg | INTRAMUSCULAR | Status: DC | PRN

## 2013-09-14 MED ORDER — KETOROLAC TROMETHAMINE 30 MG/ML IJ SOLN
INTRAMUSCULAR | Status: AC
  Filled 2013-09-14: qty 1

## 2013-09-14 MED ORDER — HYDROCODONE-ACETAMINOPHEN 5-325 MG PO TABS: 1.0000 | ORAL_TABLET | Freq: Four times a day (QID) | ORAL | Status: DC | PRN

## 2013-09-14 MED ORDER — LIDOCAINE HCL 2 % IJ SOLN
INTRAMUSCULAR | Status: AC
  Filled 2013-09-14: qty 20

## 2013-09-14 MED ORDER — FENTANYL CITRATE 0.05 MG/ML IJ SOLN
INTRAMUSCULAR | Status: AC
  Filled 2013-09-14: qty 5

## 2013-09-14 MED ORDER — DOXYCYCLINE HYCLATE 50 MG PO CAPS: 100.0000 mg | ORAL_CAPSULE | Freq: Two times a day (BID) | ORAL | Status: AC

## 2013-09-14 MED ORDER — GLYCOPYRROLATE 0.2 MG/ML IJ SOLN
INTRAMUSCULAR | Status: DC | PRN
Start: 2013-09-14 — End: 2013-09-14
  Administered 2013-09-14: 0.1 mg via INTRAVENOUS

## 2013-09-14 MED ORDER — SILVER NITRATE-POT NITRATE 75-25 % EX MISC
CUTANEOUS | Status: DC | PRN
  Administered 2013-09-14: 1

## 2013-09-14 MED ORDER — LIDOCAINE HCL 2 % IJ SOLN
INTRAMUSCULAR | Status: DC | PRN
Start: 2013-09-14 — End: 2013-09-14
  Administered 2013-09-14: 20 mL

## 2013-09-14 MED ORDER — PROPOFOL INFUSION 10 MG/ML OPTIME
INTRAVENOUS | Status: DC | PRN
  Administered 2013-09-14: 90 ug/kg/min via INTRAVENOUS

## 2013-09-14 MED ORDER — SCOPOLAMINE 1 MG/3DAYS TD PT72: 1.0000 | MEDICATED_PATCH | Freq: Once | TRANSDERMAL | Status: DC

## 2013-09-14 MED ORDER — LACTATED RINGERS IV SOLN
INTRAVENOUS | Status: DC
  Administered 2013-09-14 (×2): via INTRAVENOUS

## 2013-09-14 MED ORDER — PROPOFOL 10 MG/ML IV EMUL
INTRAVENOUS | Status: AC
  Filled 2013-09-14: qty 20

## 2013-09-14 MED ORDER — LIDOCAINE HCL (CARDIAC) 20 MG/ML IV SOLN
INTRAVENOUS | Status: DC | PRN
  Administered 2013-09-14: 80 mg via INTRAVENOUS

## 2013-09-14 MED ORDER — IBUPROFEN 600 MG PO TABS: 600.0000 mg | ORAL_TABLET | Freq: Four times a day (QID) | ORAL | Status: DC | PRN

## 2013-09-14 MED ORDER — ONDANSETRON HCL 4 MG/2ML IJ SOLN: 4.0000 mg | Freq: Once | INTRAMUSCULAR | Status: DC | PRN

## 2013-09-14 MED ORDER — MIDAZOLAM HCL 2 MG/2ML IJ SOLN
INTRAMUSCULAR | Status: AC
  Filled 2013-09-14: qty 2

## 2013-09-14 MED ORDER — FENTANYL CITRATE 0.05 MG/ML IJ SOLN: 25.0000 ug | INTRAMUSCULAR | Status: DC | PRN

## 2013-09-14 MED ORDER — KETOROLAC TROMETHAMINE 30 MG/ML IJ SOLN
INTRAMUSCULAR | Status: DC | PRN
  Administered 2013-09-14: 30 mg via INTRAVENOUS

## 2013-09-14 MED ORDER — MIDAZOLAM HCL 2 MG/2ML IJ SOLN
INTRAMUSCULAR | Status: DC | PRN
  Administered 2013-09-14: 2 mg via INTRAVENOUS

## 2013-09-14 MED ORDER — DEXAMETHASONE SODIUM PHOSPHATE 10 MG/ML IJ SOLN
INTRAMUSCULAR | Status: AC
  Filled 2013-09-14: qty 1

## 2013-09-14 SURGICAL SUPPLY — 19 items
CATH ROBINSON RED A/P 16FR (CATHETERS) ×2 IMPLANT
CLOTH BEACON ORANGE TIMEOUT ST (SAFETY) ×2 IMPLANT
DECANTER SPIKE VIAL GLASS SM (MISCELLANEOUS) ×2 IMPLANT
GLOVE BIO SURGEON STRL SZ 6.5 (GLOVE) ×2 IMPLANT
GLOVE BIOGEL PI IND STRL 7.0 (GLOVE) ×2 IMPLANT
GLOVE BIOGEL PI INDICATOR 7.0 (GLOVE) ×2
GOWN STRL REUS W/TWL LRG LVL3 (GOWN DISPOSABLE) ×4 IMPLANT
KIT BERKELEY 1ST TRIMESTER 3/8 (MISCELLANEOUS) ×2 IMPLANT
NS IRRIG 1000ML POUR BTL (IV SOLUTION) ×2 IMPLANT
PACK VAGINAL MINOR WOMEN LF (CUSTOM PROCEDURE TRAY) ×2 IMPLANT
PAD OB MATERNITY 4.3X12.25 (PERSONAL CARE ITEMS) ×2 IMPLANT
PAD PREP 24X48 CUFFED NSTRL (MISCELLANEOUS) ×2 IMPLANT
SET BERKELEY SUCTION TUBING (SUCTIONS) ×2 IMPLANT
SYRINGE 20CC LL (MISCELLANEOUS) ×2 IMPLANT
TOWEL OR 17X24 6PK STRL BLUE (TOWEL DISPOSABLE) ×4 IMPLANT
VACURETTE 10 RIGID CVD (CANNULA) IMPLANT
VACURETTE 7MM CVD STRL WRAP (CANNULA) IMPLANT
VACURETTE 8 RIGID CVD (CANNULA) IMPLANT
VACURETTE 9 RIGID CVD (CANNULA) ×2 IMPLANT

## 2013-09-14 NOTE — Anesthesia Preprocedure Evaluation (Signed)
Anesthesia Evaluation  Patient identified by MRN, date of birth, ID band Patient awake    Reviewed: Allergy & Precautions, H&P , NPO status , Patient's Chart, lab work & pertinent test results  Airway Mallampati: I TM Distance: >3 FB Neck ROM: full    Dental no notable dental hx.    Pulmonary neg pulmonary ROS,    Pulmonary exam normal       Cardiovascular negative cardio ROS      Neuro/Psych negative neurological ROS  negative psych ROS   GI/Hepatic negative GI ROS, Neg liver ROS,   Endo/Other  negative endocrine ROS  Renal/GU negative Renal ROS     Musculoskeletal   Abdominal Normal abdominal exam  (+)   Peds  Hematology negative hematology ROS (+)   Anesthesia Other Findings   Reproductive/Obstetrics                           Anesthesia Physical Anesthesia Plan  ASA: II  Anesthesia Plan: MAC   Post-op Pain Management:    Induction: Intravenous  Airway Management Planned:   Additional Equipment:   Intra-op Plan:   Post-operative Plan:   Informed Consent: I have reviewed the patients History and Physical, chart, labs and discussed the procedure including the risks, benefits and alternatives for the proposed anesthesia with the patient or authorized representative who has indicated his/her understanding and acceptance.     Plan Discussed with: CRNA and Surgeon  Anesthesia Plan Comments:         Anesthesia Quick Evaluation

## 2013-09-14 NOTE — Anesthesia Postprocedure Evaluation (Signed)
Anesthesia Post Note  Patient: Amber Good  Procedure(s) Performed: Procedure(s) (LRB): DILATATION AND EVACUATION (N/A)  Anesthesia type: MAC  Patient location: PACU  Post pain: Pain level controlled  Post assessment: Post-op Vital signs reviewed  Last Vitals:  Filed Vitals:   09/14/13 1130  BP: 106/64  Pulse: 57  Temp: 37.1 C  Resp: 15    Post vital signs: Reviewed  Level of consciousness: sedated  Complications: No apparent anesthesia complications 

## 2013-09-14 NOTE — Op Note (Signed)
Pre op DX :  Missed  abortion Postoperative Diagnosis: same Procedure:  dilation and evacuation Anesthesia:  MAC and local Surgeon:  Dr. Normand Sloop Asst : none Complications : none Procedure in detail: The patient was taken to the operating room where she was given MAC anesthesia. She was placed in dorsal lithotomy position and prepped and draped in normal sterile fashion. In and out catheter was used to drain the bladder. This was examined and noted to have a 9 week size uterus with no adnexal masses. A bivalve was placed into the vagina. A tenaculum was placed on the cervix. The cervix was infiltrated with 20 cc 2% lidocaine paracervical block. The cervix then dilated with dilators up to 21.  A size 9 suction curettage was placed into the uterine cavity. A scant amount of products of conception was seen. The suction curettage was removed when a gritty texture was noted. A sharp curette was done along all walls  of the uterus. The suction curet was placed back into the uterine cavity. No further products of conception were obtained. Sponge lap and needle counts were correct. Patient back in stable condition. The patient understood to be the risks to be, but not  limited to,  Bleeding,  Infection,  damage to internal organs by perforation of the uterus and Asherman syndrome (scarring in the uterus) leading to infertility.

## 2013-09-14 NOTE — Interval H&P Note (Signed)
History and Physical Interval Note:  09/14/2013 9:41 AM  Amber Good  has presented today for surgery, with the diagnosis of Missed Abortion  The various methods of treatment have been discussed with the patient and family. After consideration of risks, benefits and other options for treatment, the patient has consented to  Procedure(s): DILATATION AND EVACUATION (N/A) as a surgical intervention .  The patient's history has been reviewed, patient examined, no change in status, stable for surgery.  I have reviewed the patient's chart and labs.  Questions were answered to the patient's satisfaction.     [facility]

## 2013-09-14 NOTE — Transfer of Care (Signed)
Immediate Anesthesia Transfer of Care Note  Patient: Amber Good  Procedure(s) Performed: Procedure(s): DILATATION AND EVACUATION (N/A)  Patient Location: PACU  Anesthesia Type:MAC  Level of Consciousness: awake, alert , oriented and patient cooperative  Airway & Oxygen Therapy: Patient Spontanous Breathing and Patient connected to nasal cannula oxygen  Post-op Assessment: Report given to PACU RN and Post -op Vital signs reviewed and stable  Post vital signs: Reviewed and stable  Complications: No apparent anesthesia complications

## 2013-09-14 NOTE — Discharge Instructions (Signed)
DO NOT TAKE IBUPROFEN (ADVIL, ALEVE OR MOTRIN) TILL AFTER 4:30 PM!   Dilation and Curettage or Vacuum Curettage  Dilation and curettage (D&C) and vacuum curettage are minor procedures. A D&C involves stretching (dilation) the cervix and scraping (curettage) the inside lining of the womb (uterus). During a D&C, tissue is gently scraped from the inside lining of the uterus. During a vacuum curettage, the lining and tissue in the uterus are removed with the use of gentle suction.  Curettage may be performed to either diagnose or treat a problem. As a diagnostic procedure, curettage is performed to examine tissues from the uterus. A diagnostic curettage may be performed for the following symptoms:   Irregular bleeding in the uterus.   Bleeding with the development of clots.   Spotting between menstrual periods.   Prolonged menstrual periods.   Bleeding after menopause.   No menstrual period (amenorrhea).   A change in size and shape of the uterus.  As a treatment procedure, curettage may be performed for the following reasons:   Removal of an IUD (intrauterine device).   Removal of retained placenta after giving birth. Retained placenta can cause an infection or bleeding severe enough to require transfusions.   Abortion.   Miscarriage.   Removal of polyps inside the uterus.   Removal of uncommon types of noncancerous lumps (fibroids).   LET [facility] CARE PROVIDER KNOW ABOUT:   Any allergies you have.   All medicines you are taking, including vitamins, herbs, eye drops, creams, and over-the-counter medicines.   Previous problems you or members of your family have had with the use of anesthetics.   Any blood disorders you have.   Previous surgeries you have had.   Medical conditions you have.  RISKS AND COMPLICATIONS  Generally, this is a safe procedure. However, as with any procedure, complications can occur. Possible complications  include:  Excessive bleeding.   Infection of the uterus.   Damage to the cervix.   Development of scar tissue (adhesions) inside the uterus, later causing abnormal amounts of menstrual bleeding.   Complications from the general anesthetic, if a general anesthetic is used.   Putting a hole (perforation) in the uterus. This is rare.   BEFORE THE PROCEDURE   Eat and drink before the procedure only as directed by your health care provider.   Arrange for someone to take you home.   PROCEDURE  This procedure usually takes about 15-30 minutes.  You will be given one of the following:  A medicine that numbs the area in and around the cervix (local anesthetic).   A medicine to make you sleep through the procedure (general anesthetic).  You will lie on your back with your legs in stirrups.   A warm metal or plastic instrument (speculum) will be placed in your vagina to keep it open and to allow the health care provider to see the cervix.  There are two ways in which your cervix can be softened and dilated. These include:   Taking a medicine.   Having thin rods (laminaria) inserted into your cervix.   A curved tool (curette) will be used to scrape cells from the inside lining of the uterus. In some cases, gentle suction is applied with the curette. The curette will then be removed.   AFTER THE PROCEDURE   You will rest in the recovery area until you are stable and are ready to go home.   You may feel sick to your stomach (nauseous) or throw  up (vomit) if you were given a general anesthetic.   You may have a sore throat if a tube was placed in your throat during general anesthesia.   You may have light cramping and bleeding. This may last for 2 days to 2 weeks after the procedure.   Your uterus needs to make a new lining after the procedure. This may make your next period late.

## 2013-09-14 NOTE — H&P (View-Only) (Signed)
Results for orders placed during the hospital encounter of 09/11/13 (from the past 24 hour(s))  CBC     Status: None   Collection Time    09/11/13  1:10 PM      Result Value Ref Range   WBC 6.2  4.0 - 10.5 K/uL   RBC 4.35  3.87 - 5.11 MIL/uL   Hemoglobin 13.4  12.0 - 15.0 g/dL   HCT 16.1  09.6 - 04.5 %   MCV 88.0  78.0 - 100.0 fL   MCH 30.8  26.0 - 34.0 pg   MCHC 35.0  30.0 - 36.0 g/dL   RDW 40.9  81.1 - 91.4 %   Platelets 204  150 - 400 K/uL  HCG, QUANTITATIVE, PREGNANCY     Status: Abnormal   Collection Time    09/11/13  1:10 PM      Result Value Ref Range   hCG, Beta Chain, Quant, S 20102 (*) <5 mIU/mL    [place] - Intrauterine gestation at approximately 8 weeks 4 days without  evidence of fetal heart tones. Findings meet definitive criteria for  failed pregnancy.   Consulted with Dr. Estanislado Pandy  [place] results reviewed with patient and she appropriately upset. Pt was given the option of expectant management, active management with cytotec or D&E Pt has requested to review her options with her family before making a decision.  Pt DC'd to home with bleeding precautions and instructed to call the office tomorrow with her decision.      Taniya Dasher, CNM, MSN 09/11/2013. 2:32 PM

## 2013-09-14 NOTE — H&P (Signed)
Amber Good is a 23 y.o. female, G1 P0101 at 10.3 weeks by [place], diagnosed SAB on 09/11/13.    Patient Active Problem List   Diagnosis Date Noted  . 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 Preterm delivery 10/13/2010  . Preterm labor 10/11/2010  . Back pain complicating pregnancy 10/06/2010    Pregnancy Course: NOB labs done on 08/08/13.    Pt presented to MAU from the office vaginal bleeding on 8.8/15. She report spotting since Saturday 09/08/13 which increase to bleeding on 09/11/13.  Quant level on 08/13/13 were 14391.8 and 62952 on 09/11/13.  [place] on 10.0 - Intrauterine gestation at approximately 8 weeks 4 days without evidence of fetal heart tones. Findings meet definitive criteria for failed pregnancy.  Pt was given the option of expectant management, active management with cytotec or D&E and has elected to have a D&E.     [place] evaluations:  5.4 weeks - viability: No fetal pole seen yet, no cardiac activity detected 6.1 weeks - viability: Single IUP, FHT 109, normal ovaries 8.3 weeks - viability: FHR 109,  10.0 weeks - viability: IUP approximately 8.4 weeks w/o evidence of FHT   Significant prenatal events:   Failed IUP Last evaluation:   10.0 weeks   VE: closed on 09/11/13  Reason for admission:  D&E    OB History   Grav Para Term Preterm Abortions TAB SAB Ect Mult Living   Past Medical History  Diagnosis Date  . Preterm labor    Past Surgical History  Procedure Laterality Date  . No past surgeries     Family History: family history includes Cancer in her maternal grandfather and maternal grandmother; Diabetes in her maternal grandmother; Heart disease in her maternal grandmother. Social History:  reports that she has never smoked. She has never used smokeless tobacco. She reports that she does not drink alcohol or use illicit drugs.   Prenatal Transfer Tool  Maternal Diabetes: n/a Genetic Screening: n/a Maternal Ultrasounds/Referrals:  Abnormal:  Findings:   Other:No cardiac activity Fetal Ultrasounds or other Referrals:  None Maternal Substance Abuse:  No Significant Maternal Medications:  None Significant Maternal Lab Results: None   ROS:  See HPI above, all other systems are negative  Allergies  Allergen Reactions  . Cefaclor Rash      Blood pressure 111/62, pulse 51, temperature 98.1 F (36.7 C), temperature source Oral, resp. rate 20, height  (1.6 m), weight 78.926 kg (174 lb), last menstrual period 06/19/2013, SpO2 100.00%.  Maternal Exam:  Uterine Assessment: Contraction frequency - none Abdomen: Gravid, non tender. Fundal height is sga.  Normal external genitalia, vulva, cervix, uterus and adnexa.  No lesions noted on exam.   Physical Exam: Nursing note and vitals reviewed General: alert and cooperative She appears well nourished.  Psychiatric: Normal mood and affect. Her behavior is normal.  Head: Normocephalic.  Eyes: Pupils are equal, round, and reactive to light.  Neck: Normal range of motion.  Cardiovascular: RRR without murmur.  Respiratory: CTAB. Effort normal.  Abd: soft, non-tender, +BS, no rebound, no guarding  Genitourinary: Vagina normal.  Musculoskeletal: Normal range of motion.  Ext: Homan's sign negative bilaterally. No evidence of DVTs.  DTR 2+, no clonus Neurological: A&Ox3.  Skin: Warm and dry.   Prenatal labs: ABO, Rh:  B pos Antibody:  Neg Rubella:   immune RPR:   NR HBsAg:   Neg HIV:  NR GBS:  n/a Sickle cell/Hgb electrophoresis:  AA Pap:   GC:   neg Chlamydia: neg Genetic screenings:  n/a Glucola:  n/a  Assessment:  SAB   Plan:  D&E Routine Pre-OP orders   Josefita Weissmann, CNM, MSN 09/14/2013, 8:23 AM

## 2013-09-14 NOTE — Anesthesia Postprocedure Evaluation (Signed)
Anesthesia Post Note  Patient: Amber Good  Procedure(s) Performed: Procedure(s) (LRB): DILATATION AND EVACUATION (N/A)  Anesthesia type: MAC  Patient location: PACU  Post pain: Pain level controlled  Post assessment: Post-op Vital signs reviewed  Last Vitals:  Filed Vitals:   09/14/13 1130  BP: 106/64  Pulse: 57  Temp: 37.1 C  Resp: 15    Post vital signs: Reviewed  Level of consciousness: sedated  Complications: No apparent anesthesia complications

## 2013-09-17 ENCOUNTER — Encounter (HOSPITAL_COMMUNITY): Payer: Self-pay | Admitting: Obstetrics and Gynecology

## 2013-11-05 ENCOUNTER — Encounter (HOSPITAL_COMMUNITY): Payer: Self-pay | Admitting: Obstetrics and Gynecology

## 2014-05-31 ENCOUNTER — Encounter (HOSPITAL_COMMUNITY): Payer: Self-pay | Admitting: *Deleted

## 2016-01-18 IMAGING — US US OB COMP LESS 14 WK
2 series · 14 of 28 positions shown · non-contrast
Comparison: None.

CLINICAL DATA: Pelvic and back pain. Gestational age by LMP of 5
weeks 2 days.

EXAM:
OBSTETRIC <14 WK US AND TRANSVAGINAL OB US
TECHNIQUE: Both transabdominal and transvaginal ultrasound examinations were
performed for complete evaluation of the gestation as well as the
maternal uterus, adnexal regions, and pelvic cul-de-sac.
Transvaginal technique was performed to assess early pregnancy.

[Series 1: us ob comp less 14 wks · 13 of 80 slices shown (1 of 2)]
[im 4/80]
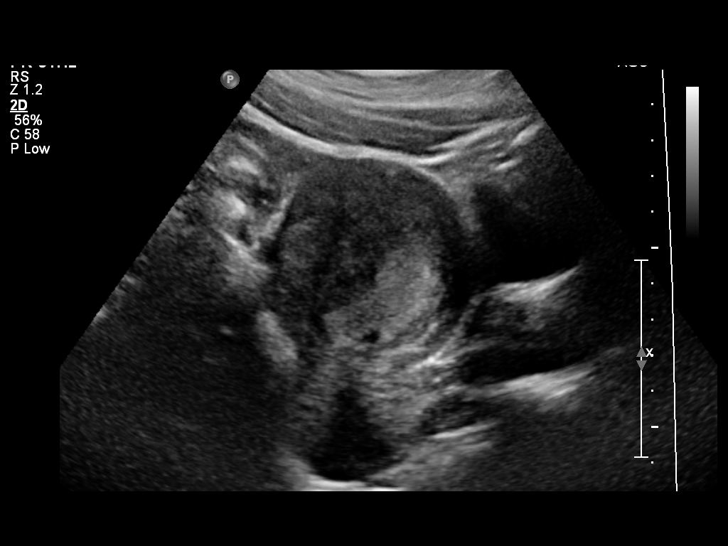
[im 10/80]
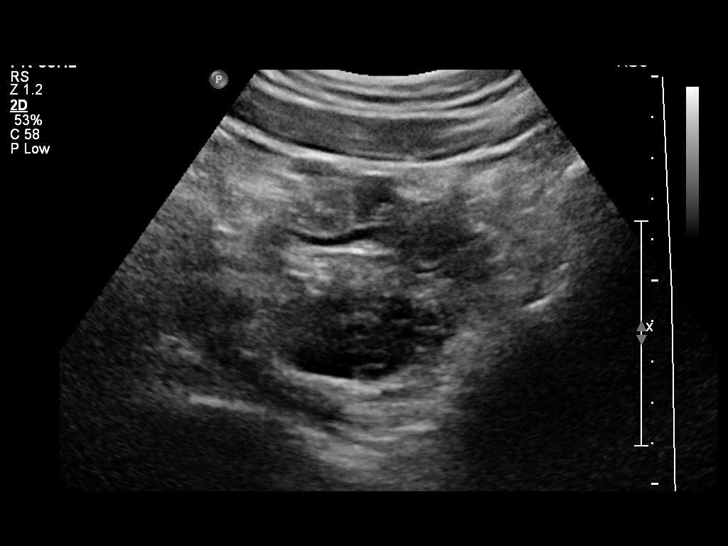
[im 16/80]
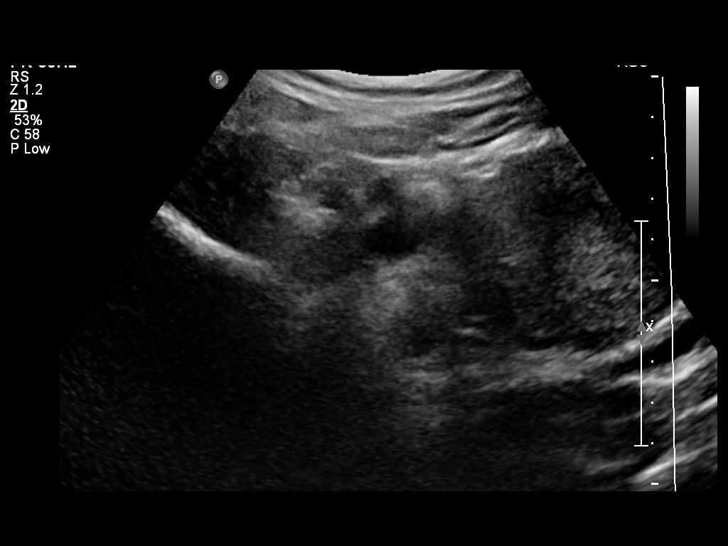
[im 22/80]
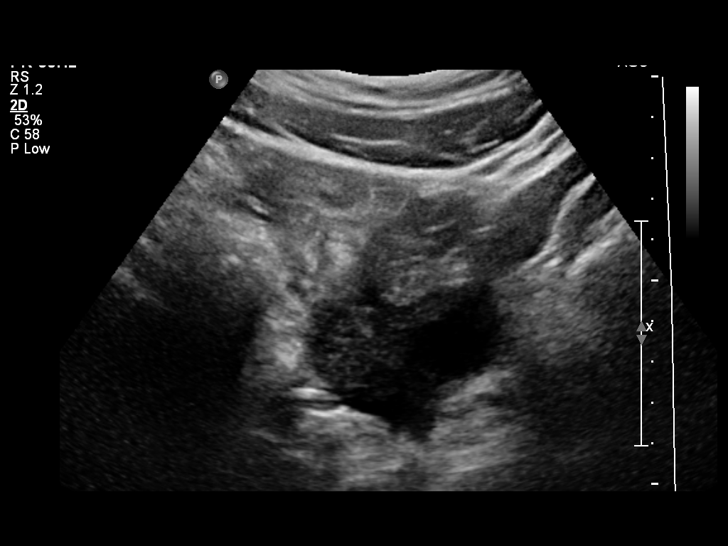
[im 28/80]
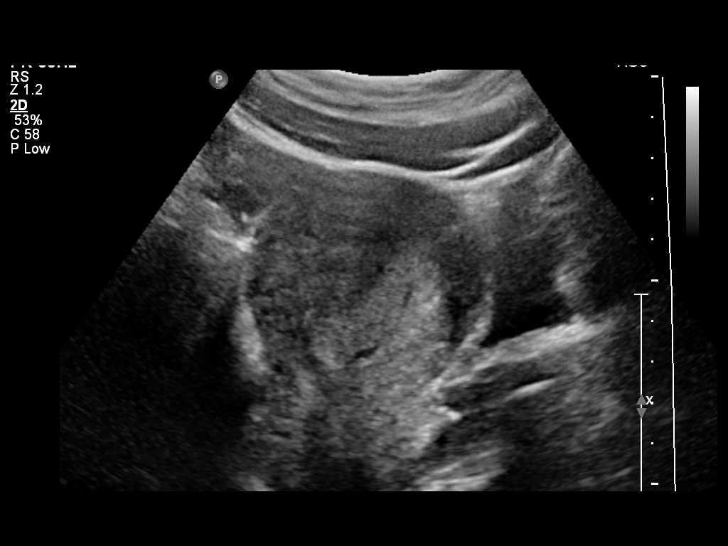
[im 34/80]
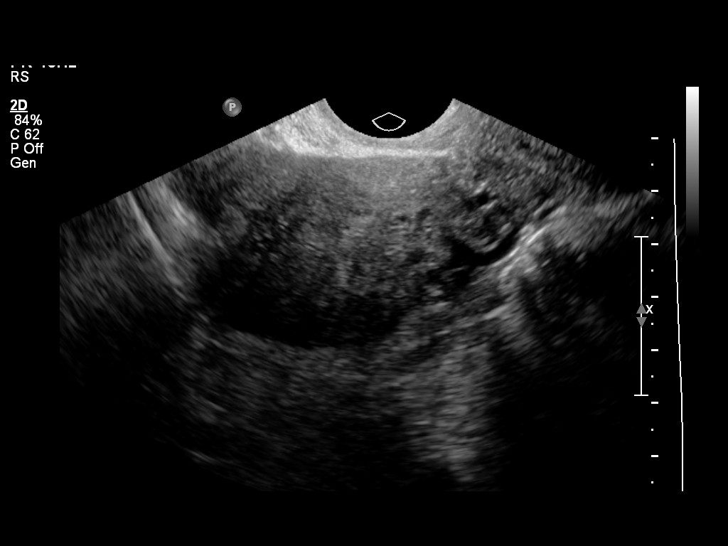
[im 40/80]
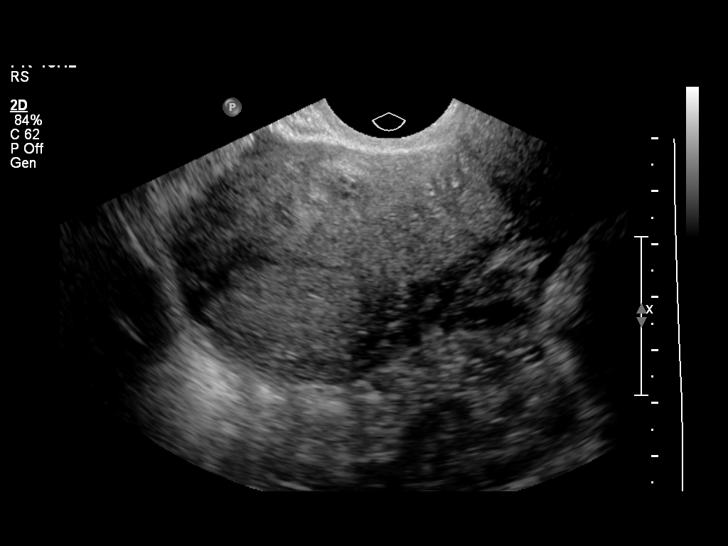
[im 46/80]
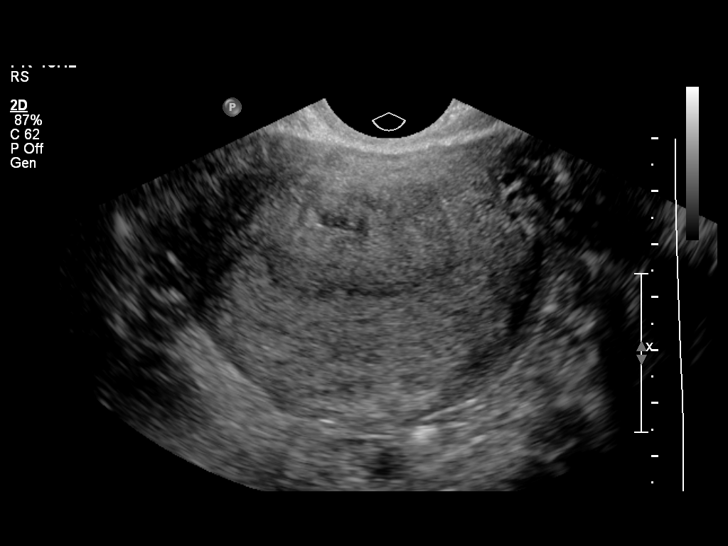
[im 52/80]
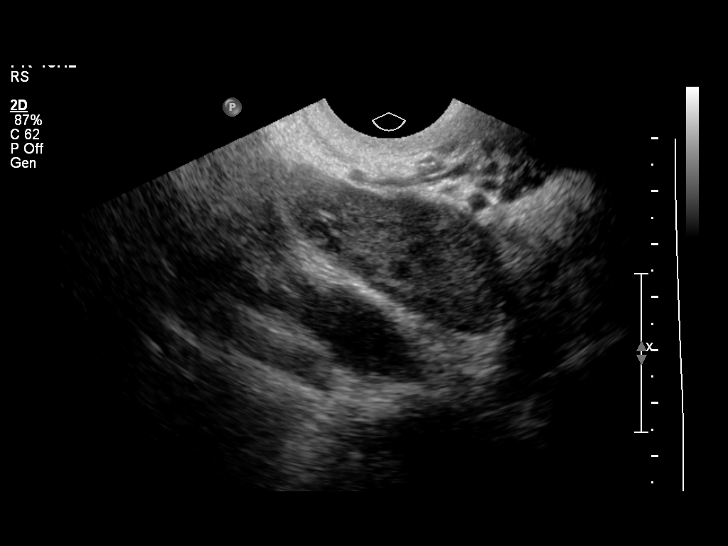
[im 58/80]
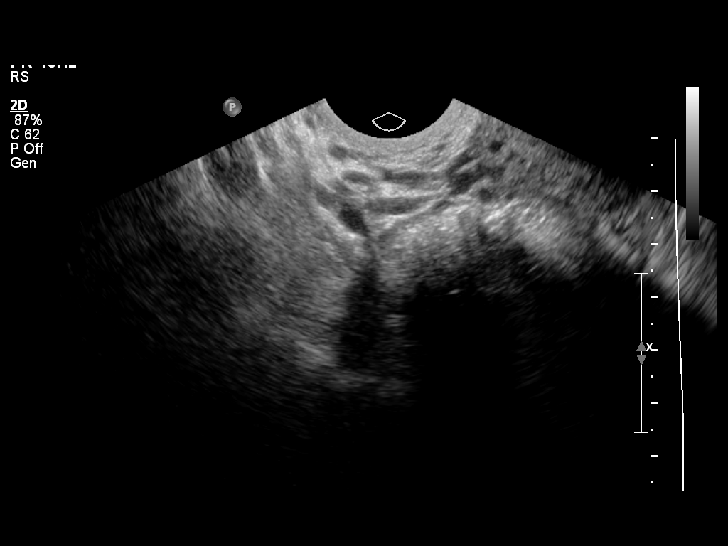
[im 64/80]
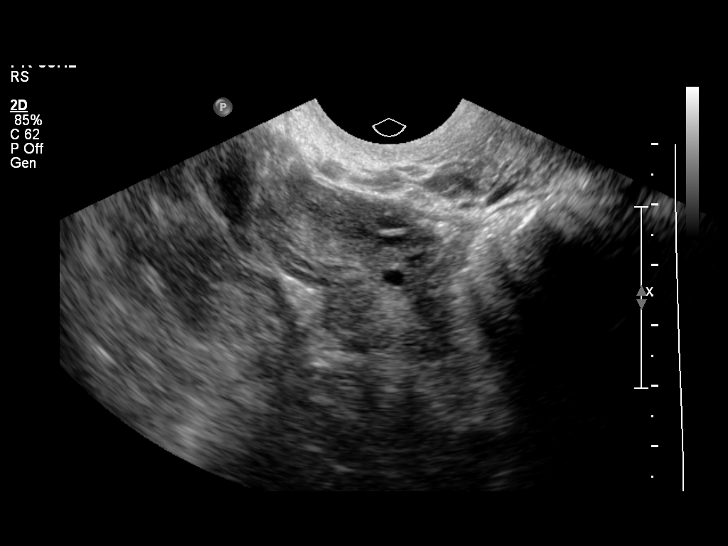
[im 70/80]
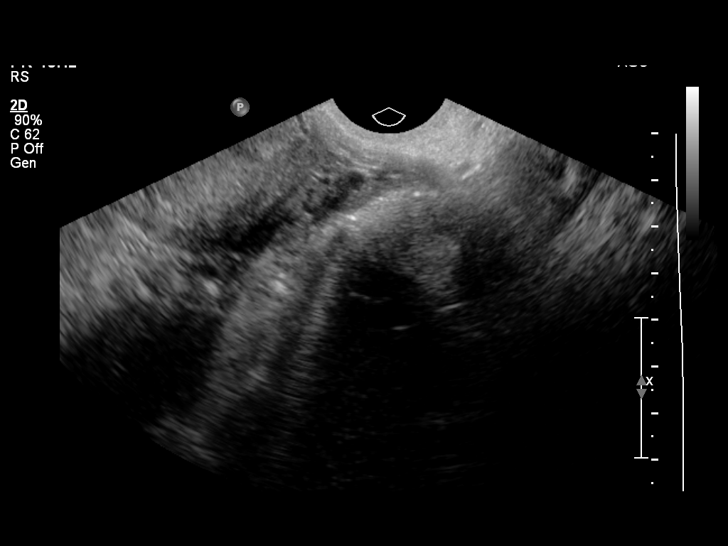
[im 76/80]
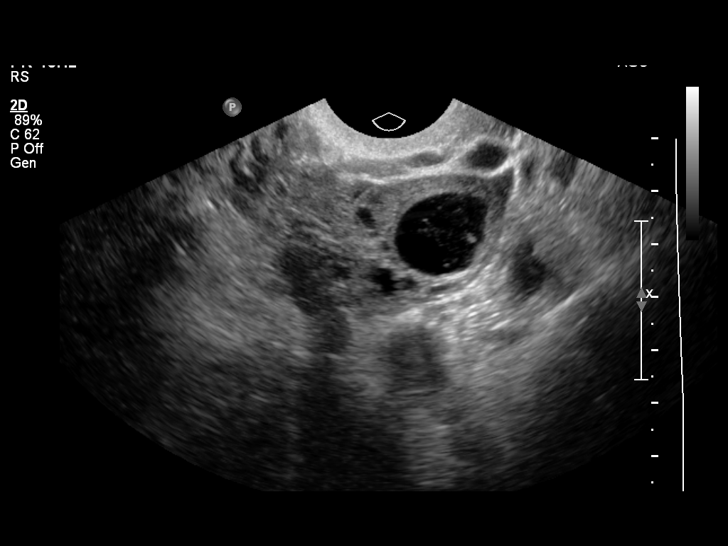

[Series 1: us ob comp less 14 wks · 1 of 3 slices shown (2 of 2)]
[im 1/3]
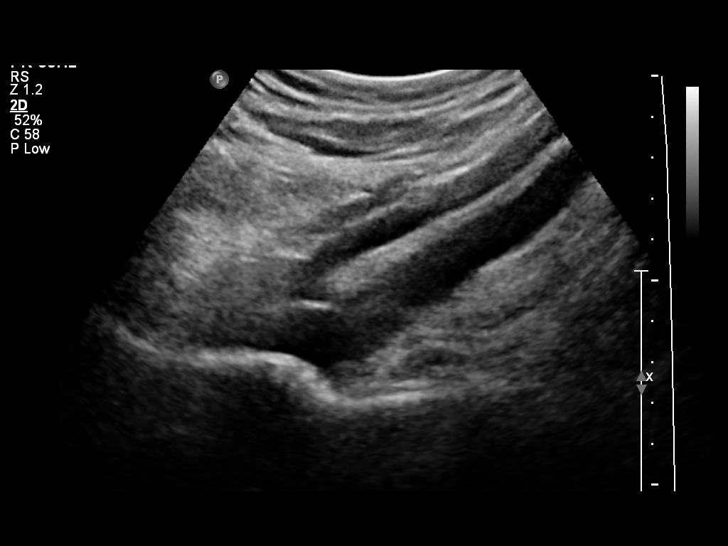

[14 of 28 positions shown; findings below may reference images not displayed]

FINDINGS: No intrauterine gestational sac visualized in endometrial cavity.
Endometrial thickness measures approximately 19 mm transvaginally.
No fibroids identified.

Both ovaries are normal in appearance. No adnexal mass or free fluid
identified.
IMPRESSION: No IUP or adnexal mass visualized. Differential diagnosis includes
recent spontaneous abortion, IUP too early to visualize, and occult
ectopic pregnancy. Recommend close follow up of quantitative B-HCG
levels, and follow up US as clinically warranted.

## 2016-03-05 IMAGING — US US OB TRANSVAGINAL
2 series · 14 of 28 positions shown · non-contrast
Comparison: 08/11/2013

CLINICAL DATA: Vaginal bleeding

EXAM:
TRANSVAGINAL OB ULTRASOUND
TECHNIQUE: Transvaginal ultrasound was performed for complete evaluation of the
gestation as well as the maternal uterus, adnexal regions, and
pelvic cul-de-sac.

[Series 1: us ob transvaginal · 0.14mm/px · 13 of 49 slices shown (1 of 2)]
[im 2/49]
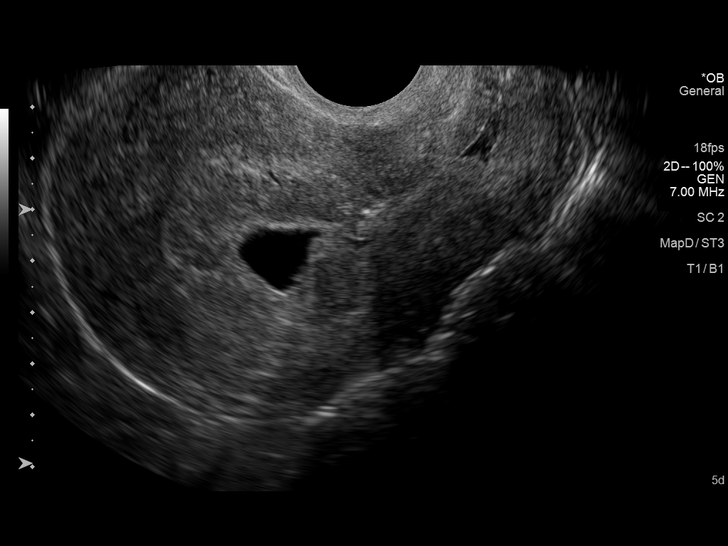
[im 6/49]
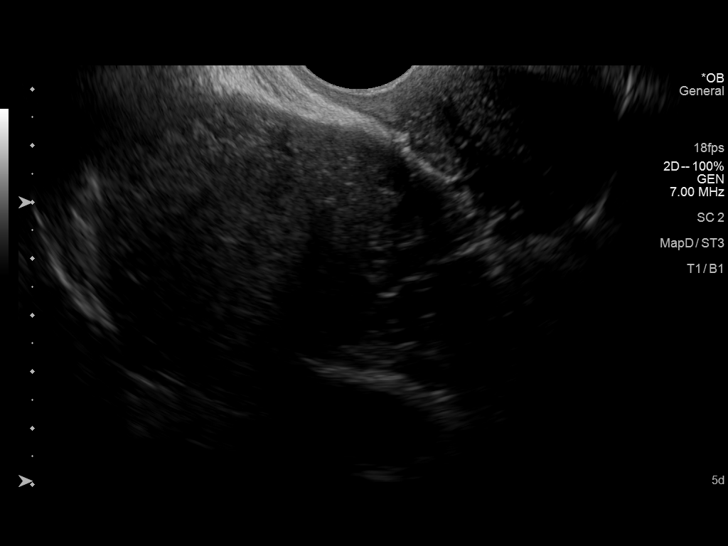
[im 10/49]
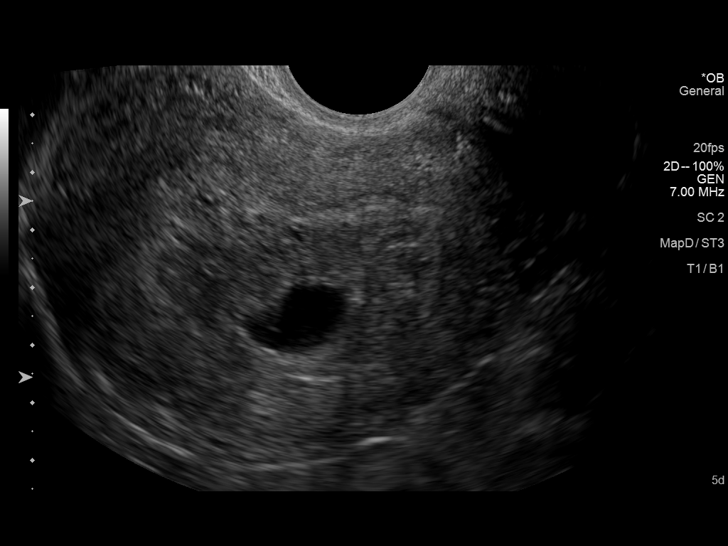
[im 13/49]
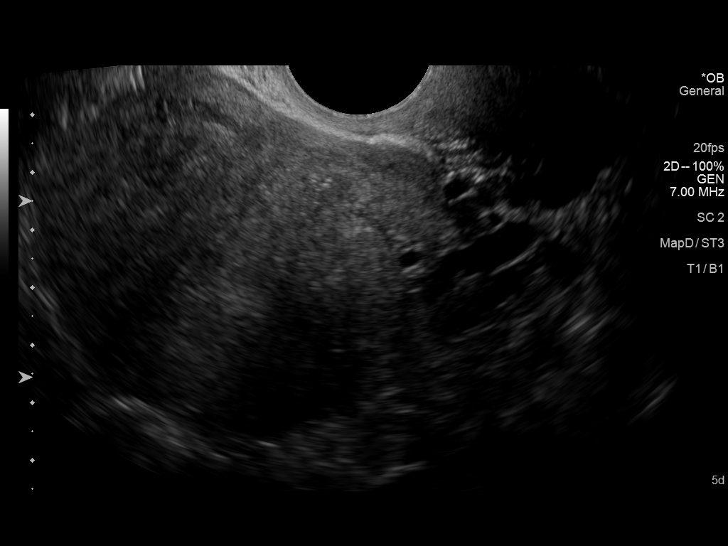
[im 17/49]
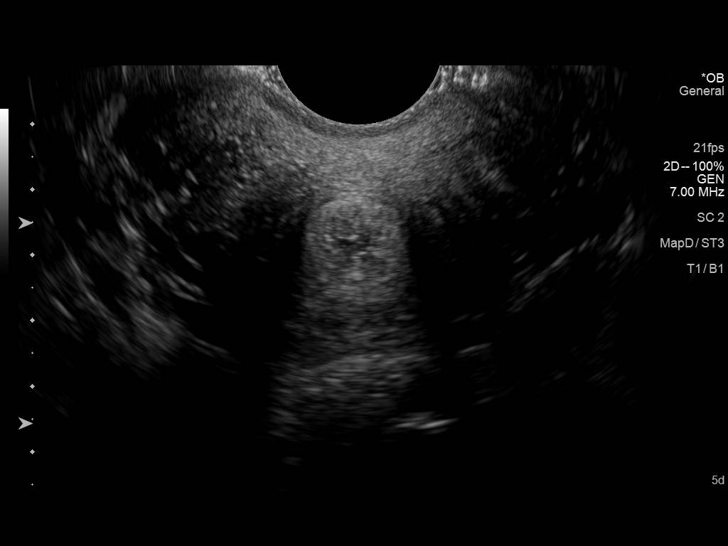
[im 21/49]
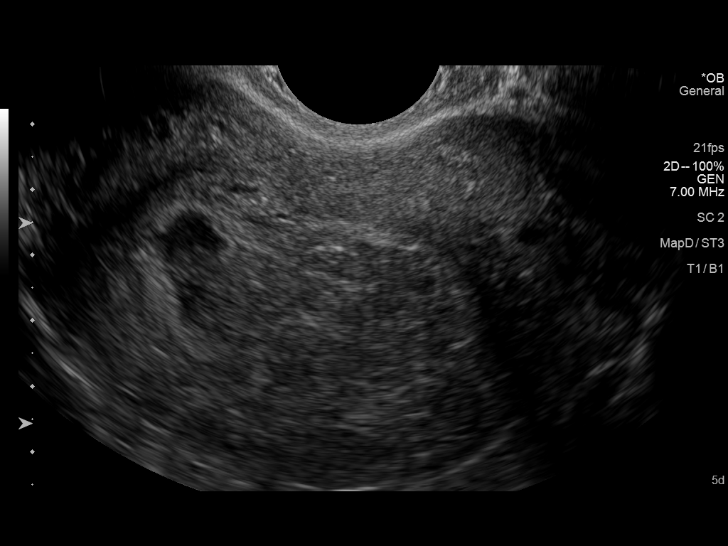
[im 25/49]
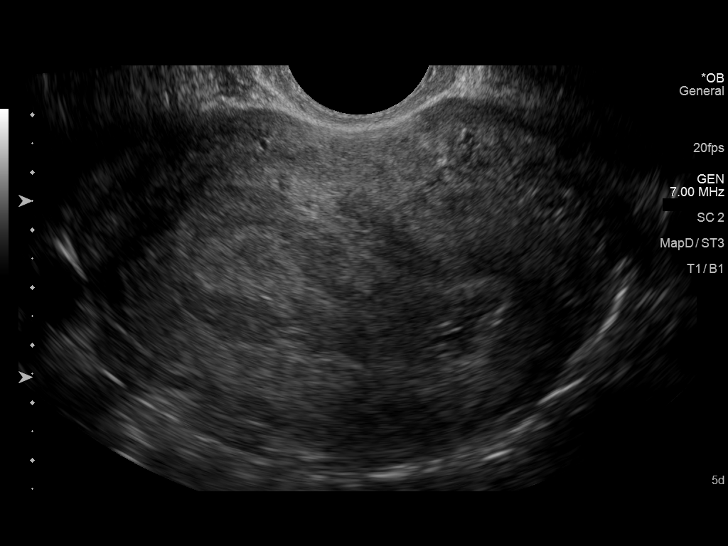
[im 28/49]
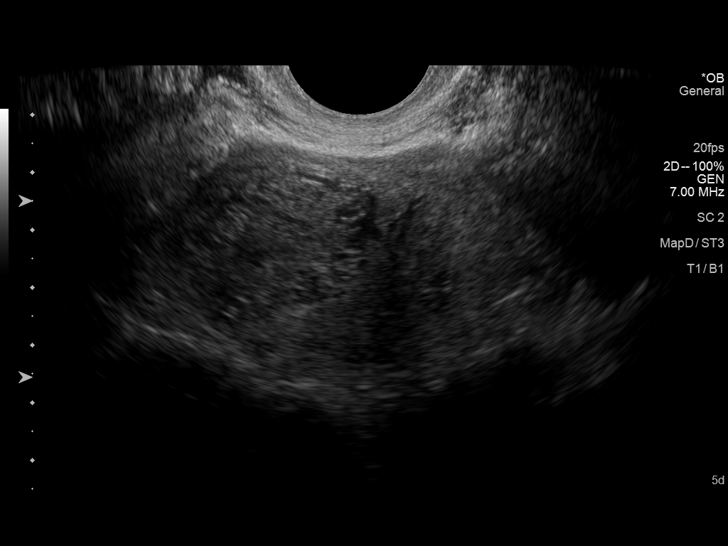
[im 32/49]
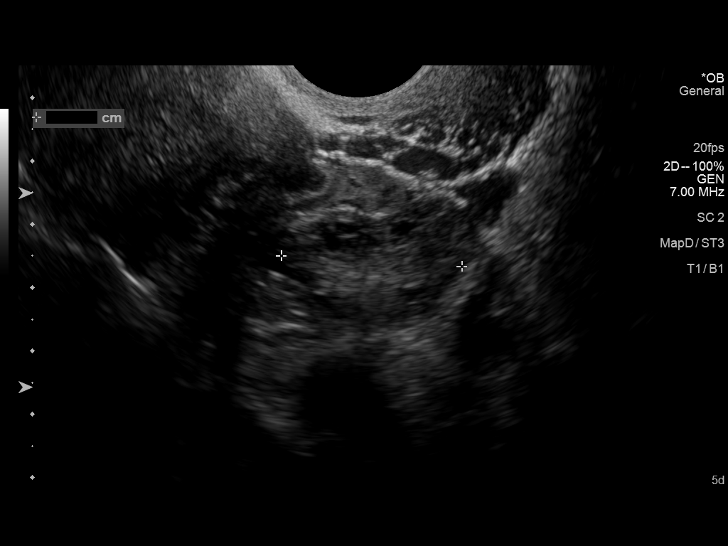
[im 36/49]
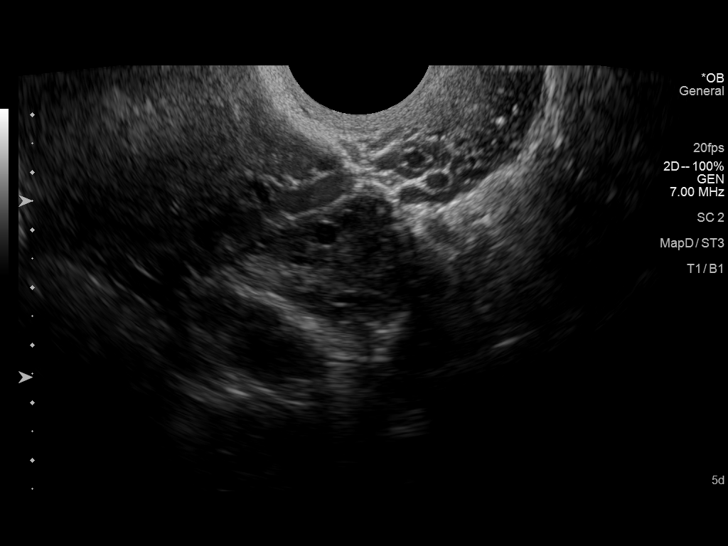
[im 39/49]
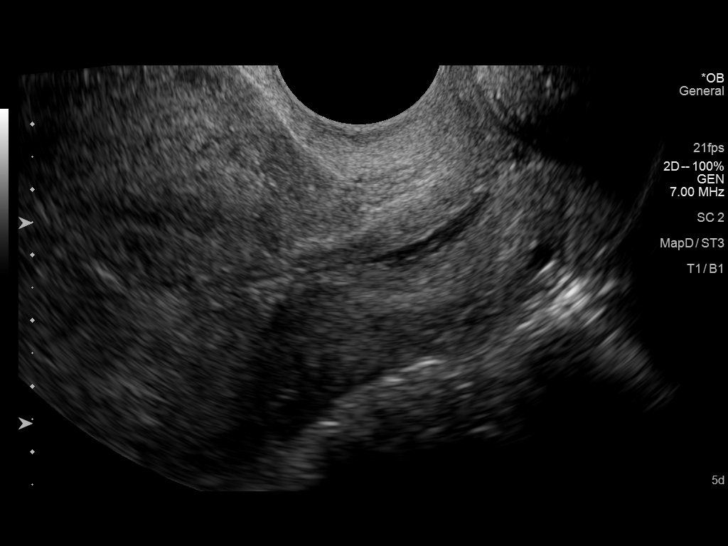
[im 43/49]
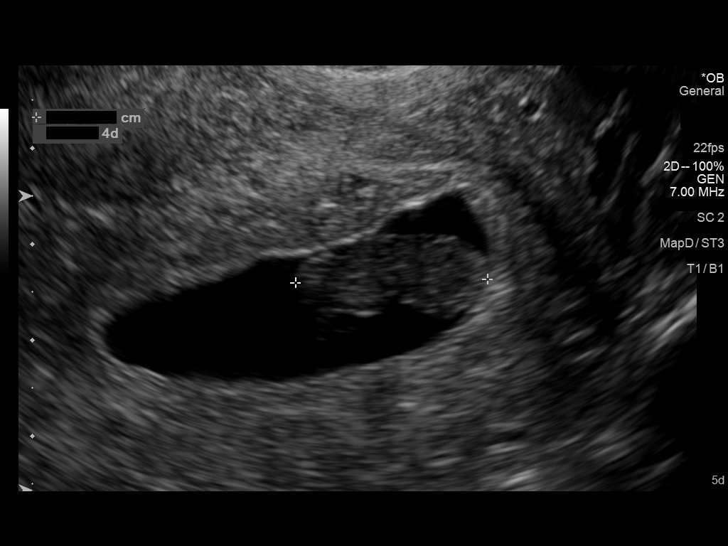
[im 47/49]
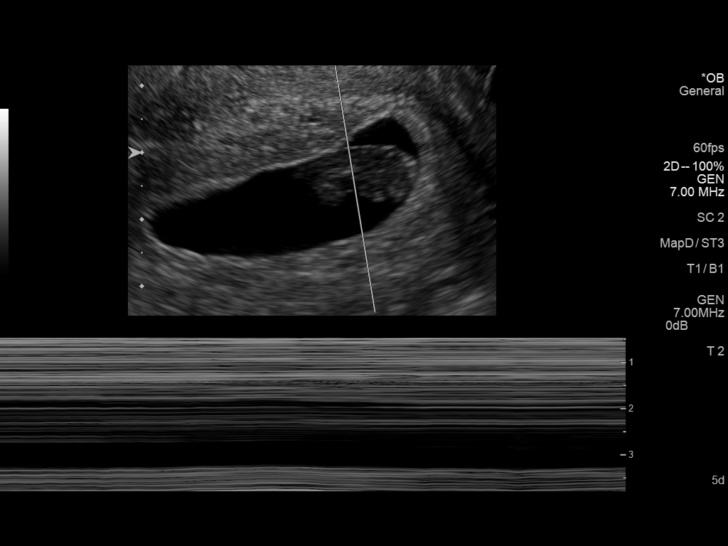

[Series 3: us ob transvaginal · 0.07mm/px · 1 of 1 slices shown (2 of 2)]
[im 1/1]
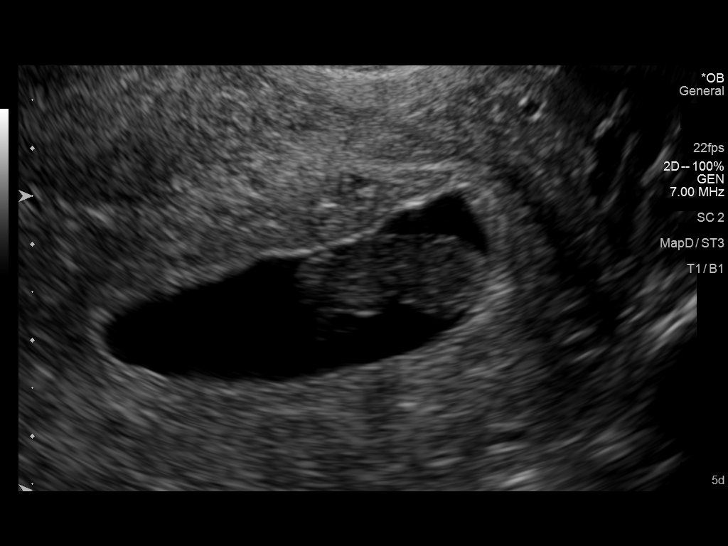

[14 of 28 positions shown; findings below may reference images not displayed]

FINDINGS: Intrauterine gestational sac: Well visualized

Yolk sac:  Not seen

Embryo:  Visualized

Cardiac Activity: None identified

CRL:   20  mm   8 w 4 d                  US EDC: 04/19/2014

Maternal uterus/adnexae: Unremarkable.  No free fluid is noted.
IMPRESSION: Intrauterine gestation at approximately 8 weeks 4 days without
evidence of fetal heart tones. Findings meet definitive criteria for
failed pregnancy. This follows SRU consensus guidelines: Diagnostic
Criteria for Nonviable Pregnancy Early in the First Trimester. N
Engl J Med 9081;[DATE].

## 2016-07-20 ENCOUNTER — Inpatient Hospital Stay (HOSPITAL_COMMUNITY)
Admission: AD | Admit: 2016-07-20 | Discharge: 2016-07-20 | Disposition: A | Discharge status: Home / Self Care | Payer: Medicaid Other | Source: Ambulatory Visit | Attending: Family Medicine | Admitting: Family Medicine

## 2016-07-20 ENCOUNTER — Encounter (HOSPITAL_COMMUNITY): Payer: Self-pay | Admitting: *Deleted

## 2016-07-20 ENCOUNTER — Inpatient Hospital Stay (HOSPITAL_COMMUNITY): Payer: Medicaid Other

## 2016-07-20 DIAGNOSIS — O209 Hemorrhage in early pregnancy, unspecified: Secondary | ICD-10-CM

## 2016-07-20 DIAGNOSIS — O3680X0 Pregnancy with inconclusive fetal viability, not applicable or unspecified: Secondary | ICD-10-CM

## 2016-07-20 DIAGNOSIS — R1030 Lower abdominal pain, unspecified: Secondary | ICD-10-CM | POA: Insufficient documentation

## 2016-07-20 DIAGNOSIS — Z91040 Latex allergy status: Secondary | ICD-10-CM | POA: Diagnosis not present

## 2016-07-20 DIAGNOSIS — O26899 Other specified pregnancy related conditions, unspecified trimester: Secondary | ICD-10-CM

## 2016-07-20 DIAGNOSIS — Z3A01 Less than 8 weeks gestation of pregnancy: Secondary | ICD-10-CM | POA: Insufficient documentation

## 2016-07-20 DIAGNOSIS — R109 Unspecified abdominal pain: Secondary | ICD-10-CM

## 2016-07-20 DIAGNOSIS — O26851 Spotting complicating pregnancy, first trimester: Secondary | ICD-10-CM | POA: Diagnosis not present

## 2016-07-20 LAB — CBC
HCT: 36.6 % (ref 36.0–46.0)
Hemoglobin: 12.3 g/dL (ref 12.0–15.0)
MCH: 29.2 pg (ref 26.0–34.0)
MCHC: 33.6 g/dL (ref 30.0–36.0)
MCV: 86.9 fL (ref 78.0–100.0)
PLATELETS: 200 10*3/uL (ref 150–400)
RBC: 4.21 MIL/uL (ref 3.87–5.11)
RDW: 14.1 % (ref 11.5–15.5)
WBC: 6.6 10*3/uL (ref 4.0–10.5)

## 2016-07-20 LAB — WET PREP, GENITAL
Clue Cells Wet Prep HPF POC: NONE SEEN
Sperm: NONE SEEN
Trich, Wet Prep: NONE SEEN
Yeast Wet Prep HPF POC: NONE SEEN

## 2016-07-20 LAB — URINALYSIS, ROUTINE W REFLEX MICROSCOPIC
BILIRUBIN URINE: NEGATIVE
GLUCOSE, UA: NEGATIVE mg/dL
HGB URINE DIPSTICK: NEGATIVE
Ketones, ur: NEGATIVE mg/dL
Leukocytes, UA: NEGATIVE
Nitrite: NEGATIVE
PH: 7 (ref 5.0–8.0)
Protein, ur: NEGATIVE mg/dL
Specific Gravity, Urine: 1.019 (ref 1.005–1.030)

## 2016-07-20 LAB — POCT PREGNANCY, URINE: PREG TEST UR: POSITIVE — AB

## 2016-07-20 LAB — HCG, QUANTITATIVE, PREGNANCY: hCG, Beta Chain, Quant, S: 1201 m[IU]/mL — ABNORMAL HIGH (ref <5)

## 2016-07-20 NOTE — MAU Note (Signed)
Patient presents to MAU with c/o bilateral lower abdominal cramping pain for past week.  She took two +HPTs on Sunday.  Having slight spotting now, denies other vaginal discharge.  LMP was 06/14/16.  She had been taking BC pills but stopped taking them 06/21/16.

## 2016-07-20 NOTE — Progress Notes (Signed)
Amber SealChristina N Marshallis an 26 y.o.femalec/o gradual onset of persistent bilateral lower abdominal cramping for the past week. Rates her current discomfort as 7-8/10 at worst. She took x2 home pregnancy tests this past weekend and both were positive. She has felt fatigued and nauseous since x1 week and vomited once over the weekend. Patient reports slight amount of light pink/red vaginal spotting intermittently when she wipes for the past week. Denies fever, vaginal discharge, odor, urinary frequency, urgency, hematuria, dysuria. No known new exposure to STDs. Patient has a h/o abnormal Pap smear with ASCUS and positive high risk HPV.   Pertinent Gynecological History: Menses: baseline for patient, LNMP 06/14/16 Bleeding: light vaginal spotting x1 week Contraception: none, stopped her OCP 06/21/16 due to medication intolerance DES exposure: unknown Blood transfusions: none Sexually transmitted diseases: HPV Previous GYN Procedures: DNC OB History: G3, P1011  Menstrual History: Patient's last menstrual period was 06/14/2016 (approximate).      Past Medical History:  Diagnosis Date  . Preterm labor          Past Surgical History:  Procedure Laterality Date  . DILATION AND CURETTAGE OF UTERUS    . DILATION AND EVACUATION N/A 09/14/2013   Procedure: DILATATION AND EVACUATION; Surgeon: Michael LitterNaima A Dillard, MD; Location: [facility]; Service: Gynecology; Laterality: N/A;  . LIPOSUCTION AUOLOGOUS FAT TRANSFER TO BUTTOCKS           Family History  Problem Relation Age of Onset  . Cancer Maternal Grandmother   . Diabetes Maternal Grandmother   . Heart disease Maternal Grandmother   . Cancer Maternal Grandfather     Social History: reports that she has never smoked. She has never used smokeless tobacco. She reports that she drinks alcohol. She reports that she does not use drugs.  Allergies:       Allergies  Allergen  Reactions  . Latex Other (See Comments)    irritation  . Cefaclor Rash    No prescriptions prior to admission.    Review of Systems  Constitutional: Positive for malaise/fatigue. Negative for chillsand fever.  Respiratory: Negative for shortness of breath.  Cardiovascular: Negative for chest pain.  Gastrointestinal: Positive for abdominal painand nausea. Negative for constipation, diarrheaand vomiting.  Genitourinary: Negative for dysuria, flank pain, frequency, hematuriaand urgency.  Musculoskeletal: Negative for back pain.  Skin: Negative for rash.    Blood pressure (!) 113/58, pulse 66, temperature 98.3 F (36.8 C), temperature source Oral, resp. rate 15, height 5\' 3"  (1.6 m), weight 168 lb 4 oz (76.3 kg), last menstrual period 06/14/2016, SpO2 99 %, unknown if currently breastfeeding. Physical Exam Constitutional: She appears well-developedand well-nourished. No distress.  HENT:  Head: Normocephalicand atraumatic.  GI: Soft. She exhibits no distensionand no mass. There is tenderness. There is no reboundand no guarding.  Genitourinary: Pelvic exam was performed with patient supine. There is no rash, tendernessor lesionon the right labia. There is no rash, tendernessor lesionon the left labia. Cervix exhibits discharge. Cervix exhibits no motion tenderness. Right adnexum displays no mass, no tendernessand no fullness. Left adnexum displays no mass, no tendernessand no fullness. No erythema, tendernessor bleedingin the vagina. No foreign bodyin the vagina. No signs of injuryaround the vagina. Vaginal discharge(thin white discharge)found.    Results for orders placed or performed during the hospital encounter of 07/20/16 (from the past 24 hour(s))  Urinalysis, Routine w reflex microscopic ([facility])     Status: None   Collection Time: 07/20/16 12:40 PM  Result Value Ref Range   Color, Urine  YELLOW YELLOW   APPearance CLEAR CLEAR   Specific  Gravity, Urine 1.019 1.005 - 1.030   pH 7.0 5.0 - 8.0   Glucose, UA NEGATIVE NEGATIVE mg/dL   Hgb urine dipstick NEGATIVE NEGATIVE   Bilirubin Urine NEGATIVE NEGATIVE   Ketones, ur NEGATIVE NEGATIVE mg/dL   Protein, ur NEGATIVE NEGATIVE mg/dL   Nitrite NEGATIVE NEGATIVE   Leukocytes, UA NEGATIVE NEGATIVE  Pregnancy, urine POC     Status: Abnormal   Collection Time: 07/20/16 12:45 PM  Result Value Ref Range   Preg Test, Ur POSITIVE (A) NEGATIVE  CBC     Status: None   Collection Time: 07/20/16  1:29 PM  Result Value Ref Range   WBC 6.6 4.0 - 10.5 K/uL   RBC 4.21 3.87 - 5.11 MIL/uL   Hemoglobin 12.3 12.0 - 15.0 g/dL   HCT 16.1 09.6 - 04.5 %   MCV 86.9 78.0 - 100.0 fL   MCH 29.2 26.0 - 34.0 pg   MCHC 33.6 30.0 - 36.0 g/dL   RDW 40.9 81.1 - 91.4 %   Platelets 200 150 - 400 K/uL  hCG, quantitative, pregnancy     Status: Abnormal   Collection Time: 07/20/16  1:29 PM  Result Value Ref Range   hCG, Beta Chain, Quant, S 1,201 (H) <5 mIU/mL  Wet prep, genital     Status: Abnormal   Collection Time: 07/20/16  1:40 PM  Result Value Ref Range   Yeast Wet Prep HPF POC NONE SEEN NONE SEEN   Trich, Wet Prep NONE SEEN NONE SEEN   Clue Cells Wet Prep HPF POC NONE SEEN NONE SEEN   WBC, Wet Prep HPF POC MODERATE (A) NONE SEEN   Sperm NONE SEEN    US Ob Comp Less 14 Wks  Result Date: 07/20/2016 CLINICAL DATA:  Abdominal pain during pregnancy EXAM: OBSTETRIC <14 WK [place] AND TRANSVAGINAL OB US TECHNIQUE: Both transabdominal and transvaginal ultrasound examinations were performed for complete evaluation of the gestation as well as the maternal uterus, adnexal regions, and pelvic cul-de-sac. Transvaginal technique was performed to assess early pregnancy. COMPARISON:  None for this gestation FINDINGS: Intrauterine gestational sac: Present Yolk sac:  Absent Embryo:  Absent Cardiac Activity: N/A Heart Rate: N/A  bpm MSD: 3.0  mm   4 w   6  d Subchorionic hemorrhage:  None visualized. Maternal  uterus/adnexae: RIGHT ovary normal size and morphology, 3.8 x 2.1 x 2.6 cm. LEFT ovary measures 5.2 x 3.0 x 3.6 cm and contains a complicated question hemorrhagic corpus luteum. Trace free pelvic fluid. No adnexal masses. IMPRESSION: Tiny gestational sac within the uterus without visualization of a yolk sac or fetal pole to establish viability. Otherwise negative exam. Electronically Signed   By: Ulyses Southward M.D.   On: 07/20/2016 14:52   US Ob Transvaginal  Result Date: 07/20/2016 CLINICAL DATA:  Abdominal pain during pregnancy EXAM: OBSTETRIC <14 WK [place] AND TRANSVAGINAL OB US TECHNIQUE: Both transabdominal and transvaginal ultrasound examinations were performed for complete evaluation of the gestation as well as the maternal uterus, adnexal regions, and pelvic cul-de-sac. Transvaginal technique was performed to assess early pregnancy. COMPARISON:  None for this gestation FINDINGS: Intrauterine gestational sac: Present Yolk sac:  Absent Embryo:  Absent Cardiac Activity: N/A Heart Rate: N/A  bpm MSD: 3.0  mm   4 w   6  d Subchorionic hemorrhage:  None visualized. Maternal uterus/adnexae: RIGHT ovary normal size and morphology, 3.8 x 2.1 x 2.6 cm. LEFT ovary measures 5.2  x 3.0 x 3.6 cm and contains a complicated question hemorrhagic corpus luteum. Trace free pelvic fluid. No adnexal masses. IMPRESSION: Tiny gestational sac within the uterus without visualization of a yolk sac or fetal pole to establish viability. Otherwise negative exam. Electronically Signed   By: Ulyses Southward M.D.   On: 07/20/2016 14:52      Assessment/Plan:  The patient is a well appearing female who presents today with lower abd cramping and light vaginal spotting in the setting of (+) HPT this past weekend. Pregnancy confirmed in office today, BhCG (1200) and patient was sent for ultrasound to confirm IUP, however yolk sac was not identified. Patient is to return for F/U in 24-48 hours for repeat BhCG/quant and repeat US to confirm  IUP.   Lucendia Herrlich 07/20/2016, 1:49 PM    I confirm that I have verified the information documented in the PA student's note and that I have also personally performed the physical exam and all medical decision making activities.  Ultrasound shows possible IUGS, no yolk sac, BHCG 1200 Will have patient go to [facility] Friday morning for repeat BHCG Discussed reasons to return to MAU including s/s of ectopic pregnancy  Judeth Horn, NP

## 2016-07-20 NOTE — Discharge Instructions (Signed)
Return to care  °· If you have heavier bleeding that soaks through more that 2 pads per hour for an hour or more °· If you bleed so much that you feel like you might pass out or you do pass out °· If you have significant abdominal pain that is not improved with Tylenol  °· If you develop a fever > 100.5 ° °

## 2016-07-21 LAB — GC/CHLAMYDIA PROBE AMP (~~LOC~~) NOT AT ARMC
Chlamydia: NEGATIVE
Neisseria Gonorrhea: NEGATIVE

## 2016-07-23 ENCOUNTER — Ambulatory Visit: Payer: Medicaid Other | Admitting: General Practice

## 2016-07-23 DIAGNOSIS — O3680X0 Pregnancy with inconclusive fetal viability, not applicable or unspecified: Secondary | ICD-10-CM

## 2016-07-23 LAB — HCG, QUANTITATIVE, PREGNANCY: HCG, BETA CHAIN, QUANT, S: 4198 m[IU]/mL — AB (ref <5)

## 2016-07-23 NOTE — Progress Notes (Signed)
Patient here for stat bhcg today. Patient denies pain or bleeding. Patient informed to wait in lobby for results & updated plan of care. Patient verbalizes understanding. Spoke with Dr Marice Potterove regarding patient's results who finds increase in bhcg levels favorable for progressing pregnancy. Patient should have follow up ultrasound in 1 week. Scheduled ultrasound for 7/30 @ 1045. Informed patient of results, ultrasound appt & brief nurse visit to follow for results. Patient verbalized understanding to all & had no questions

## 2016-07-26 ENCOUNTER — Ambulatory Visit: Payer: Medicaid Other

## 2016-08-02 ENCOUNTER — Ambulatory Visit (HOSPITAL_COMMUNITY): Payer: Medicaid Other

## 2017-02-16 ENCOUNTER — Inpatient Hospital Stay (HOSPITAL_COMMUNITY)
Admission: AD | Admit: 2017-02-16 | Discharge: 2017-02-16 | Disposition: A | Discharge status: Home / Self Care | Payer: Medicaid Other | Source: Ambulatory Visit | Attending: Obstetrics and Gynecology | Admitting: Obstetrics and Gynecology

## 2017-02-16 ENCOUNTER — Encounter (HOSPITAL_COMMUNITY): Payer: Self-pay | Admitting: *Deleted

## 2017-02-16 ENCOUNTER — Other Ambulatory Visit: Payer: Self-pay

## 2017-02-16 DIAGNOSIS — O26893 Other specified pregnancy related conditions, third trimester: Secondary | ICD-10-CM | POA: Insufficient documentation

## 2017-02-16 DIAGNOSIS — Z881 Allergy status to other antibiotic agents status: Secondary | ICD-10-CM | POA: Diagnosis not present

## 2017-02-16 DIAGNOSIS — O26899 Other specified pregnancy related conditions, unspecified trimester: Secondary | ICD-10-CM

## 2017-02-16 DIAGNOSIS — R102 Pelvic and perineal pain: Secondary | ICD-10-CM | POA: Insufficient documentation

## 2017-02-16 DIAGNOSIS — O9989 Other specified diseases and conditions complicating pregnancy, childbirth and the puerperium: Secondary | ICD-10-CM

## 2017-02-16 DIAGNOSIS — M549 Dorsalgia, unspecified: Secondary | ICD-10-CM | POA: Insufficient documentation

## 2017-02-16 DIAGNOSIS — Z3A35 35 weeks gestation of pregnancy: Secondary | ICD-10-CM | POA: Diagnosis not present

## 2017-02-16 DIAGNOSIS — O09893 Supervision of other high risk pregnancies, third trimester: Secondary | ICD-10-CM

## 2017-02-16 DIAGNOSIS — N949 Unspecified condition associated with female genital organs and menstrual cycle: Secondary | ICD-10-CM

## 2017-02-16 DIAGNOSIS — O09213 Supervision of pregnancy with history of pre-term labor, third trimester: Secondary | ICD-10-CM

## 2017-02-16 HISTORY — DX: Unspecified ovarian cyst, unspecified side: N83.209

## 2017-02-16 HISTORY — DX: Unspecified condition associated with female genital organs and menstrual cycle: N94.9

## 2017-02-16 HISTORY — DX: Headache, unspecified: R51.9

## 2017-02-16 HISTORY — DX: Headache: R51

## 2017-02-16 LAB — URINALYSIS, ROUTINE W REFLEX MICROSCOPIC
Bilirubin Urine: NEGATIVE
Glucose, UA: NEGATIVE mg/dL
Hgb urine dipstick: NEGATIVE
Ketones, ur: NEGATIVE mg/dL
Nitrite: NEGATIVE
Protein, ur: NEGATIVE mg/dL
Specific Gravity, Urine: 1.009 (ref 1.005–1.030)
pH: 6 (ref 5.0–8.0)

## 2017-02-16 LAB — FETAL FIBRONECTIN: Fetal Fibronectin: NEGATIVE

## 2017-02-16 MED ORDER — COMFORT FIT MATERNITY SUPP LG MISC: 1.0000 | Freq: Every day | 0 refills | Status: DC

## 2017-02-16 MED ORDER — CYCLOBENZAPRINE HCL 10 MG PO TABS
10.0000 mg | ORAL_TABLET | Freq: Once | ORAL | Status: AC
  Administered 2017-02-16: 10 mg via ORAL
  Filled 2017-02-16: qty 1

## 2017-02-16 MED ORDER — BETAMETHASONE SOD PHOS & ACET 6 (3-3) MG/ML IJ SUSP
12.0000 mg | Freq: Once | INTRAMUSCULAR | Status: AC
  Administered 2017-02-16: 12 mg via INTRAMUSCULAR
  Filled 2017-02-16: qty 2

## 2017-02-16 NOTE — MAU Note (Signed)
Increased discomfort with preg, esp back and legs.  Hurts to walk.  Hx of PTD at 32wk, has been getting 17P shots in [place]

## 2017-02-16 NOTE — MAU Note (Signed)
Pt is a G3P1 at 35.2 weeks with c/o back pain and cramping starting 2 days ago.  Pt has h/o PTD at 32 weeks and takes [place] once a week.  No LOF no VB positive FM, no other OB or medical concerns.

## 2017-02-16 NOTE — MAU Provider Note (Signed)
Chief Complaint:  Back Pain and Generalized Body Aches   First Provider Initiated Contact with Patient 02/16/17 0854      HPI: Amber Good is a 27 y.o. [id number] at 4135w2dwho presents to maternity admissions reporting persistent back pain for the last couple of days.  Patient is here from [place] visiting her son- records requested from [place], due to patient not going home until Sunday with hx of PTD.  She has been resting all day but is having constant back pain that radiates down her thighs.  She has not tried anything to relive the pain (no tylenol, hot/cold compress, or pregnancy belt). She reports inconsistent contractions. She reports good fetal movement, denies LOF, vaginal bleeding, vaginal itching/burning, urinary symptoms, h/a, dizziness, n/v, or fever/chills.   She has a history of preterm delivery at 32weeks and is on 17-hydroxyprogesterone but not on steroids. Pt reports having her cervix checked in the [place] office last Monday and was noted to be 2cm/posterior. Records requested.   Past Medical History: Past Medical History:  Diagnosis Date  . Chlamydia 2012  . Headache   . Ovarian cyst   . Preterm labor     Past obstetric history: OB History  Gravida Para Term Preterm AB Living  3 1   1 1 1   SAB TAB Ectopic Multiple Live Births  1       1    # Outcome Date GA Lbr Len/2nd Weight Sex Delivery Anes PTL Lv  3 Current           2 SAB 09/2013          1 Preterm 10/11/10 4524w3d 08:26 / 00:10 1.501 kg (3 lb 5 oz) M Vag-Spont EPI  LIV      Past Surgical History: Past Surgical History:  Procedure Laterality Date  . DILATION AND CURETTAGE OF UTERUS    . DILATION AND EVACUATION N/A 09/14/2013   Procedure: DILATATION AND EVACUATION;  Surgeon: Michael LitterNaima A Dillard, MD;  Location: [facility];  Service: Gynecology;  Laterality: N/A;  . LIPOSUCTION AUOLOGOUS FAT TRANSFER TO BUTTOCKS      Family History: Family History  Problem Relation Age of Onset  . Cancer Maternal Grandmother   .  Diabetes Maternal Grandmother   . Heart disease Maternal Grandmother   . Liver disease Maternal Grandfather   . Cancer Maternal Aunt   . Cancer Paternal Aunt   . Cancer Paternal Grandmother     Social History: Social History   Tobacco Use  . Smoking status: Never Smoker  . Smokeless tobacco: Never Used  Substance Use Topics  . Alcohol use: Yes    Comment: occasionally, not with preg  . Drug use: No    Allergies:  Allergies  Allergen Reactions  . Latex Other (See Comments)    irritation  . Cefaclor Rash    Meds:  Medications Prior to Admission  Medication Sig Dispense Refill Last Dose  . hydroxyprogesterone caproate (MAKENA) 250 mg/mL OIL injection Inject 250 mg into the muscle once a week.   02/07/2017  . Prenatal Vit-Fe Fumarate-FA (PRENATAL MULTIVITAMIN) TABS tablet Take 1 tablet by mouth daily at 12 noon.   02/15/2017 at Unknown time    ROS:  Review of Systems  Constitutional: Positive for malaise/fatigue.       Pt reports being "tired"  Respiratory: Negative.   Cardiovascular: Negative.   Gastrointestinal: Positive for abdominal pain. Negative for constipation, diarrhea, nausea and vomiting.  Genitourinary: Negative.  Negative for vaginal bleeding or vaginal discharge  Musculoskeletal: Positive for back pain.  Neurological: Negative.   Psychiatric/Behavioral: Negative.    I have reviewed patient's Past Medical Hx, Surgical Hx, Family Hx, Social Hx, medications and allergies.   Physical Exam   Patient Vitals for the past 24 hrs:  BP Temp Temp src Pulse Resp Weight  02/16/17 0842 118/64 98.4 F (36.9 C) Oral 84 16 -  02/16/17 0832 - - - - - 193 lb 8 oz (87.8 kg)   Constitutional: Well-developed, well-nourished female in no acute distress.  Cardiovascular: normal rate Respiratory: normal effort GI: Abd soft, non-tender, gravid appropriate for gestational age.  MS: Extremities nontender, no edema, normal ROM Neurologic: Alert and oriented x 4.  GU:  Neg CVAT.  Cervical exam done by Steward Drone CNM: No cervical change after reexamination at one hour  Dilation: 2.5 Effacement (%): Thick Cervical Position: Posterior  FHT:  Baseline 135 , moderate variability, accelerations present, no decelerations Contractions:every 8-10 minutes   Labs: Results for orders placed or performed during the hospital encounter of 02/16/17 (from the past 24 hour(s))  Urinalysis, Routine w reflex microscopic     Status: Abnormal   Collection Time: 02/16/17  8:20 AM  Result Value Ref Range   Color, Urine YELLOW YELLOW   APPearance HAZY (A) CLEAR   Specific Gravity, Urine 1.009 1.005 - 1.030   pH 6.0 5.0 - 8.0   Glucose, UA NEGATIVE NEGATIVE mg/dL   Hgb urine dipstick NEGATIVE NEGATIVE   Bilirubin Urine NEGATIVE NEGATIVE   Ketones, ur NEGATIVE NEGATIVE mg/dL   Protein, ur NEGATIVE NEGATIVE mg/dL   Nitrite NEGATIVE NEGATIVE   Leukocytes, UA LARGE (A) NEGATIVE   RBC / HPF 0-5 0 - 5 RBC/hpf   WBC, UA 6-30 0 - 5 WBC/hpf   Bacteria, UA RARE (A) NONE SEEN   Squamous Epithelial / LPF 0-5 (A) NONE SEEN   Mucus PRESENT   Fetal fibronectin     Status: None   Collection Time: 02/16/17  9:19 AM  Result Value Ref Range   Fetal Fibronectin NEGATIVE NEGATIVE    MAU Course/MDM: Complete history and physical performed. I have ordered labs and reviewed results.  UA- WNL, mostly dirty catch  NST reviewed and found to be reactive Treatments in MAU included flexeril 10mg  for back pain and betamethasone IM for advanced cervical dilation  Pt discharge with strict preterm labor precautions.   Meds ordered this encounter  Medications  . cyclobenzaprine (FLEXERIL) tablet 10 mg  . betamethasone acetate-betamethasone sodium phosphate (CELESTONE) injection 12 mg  . Elastic Bandages & Supports (COMFORT FIT MATERNITY SUPP LG) MISC    Sig: 1 Device by Does not apply route daily.    Dispense:  1 each    Refill:  0    Order Specific Question:   Supervising Provider     Answer:   CONSTANT, PEGGY [[id number]]   Patient reports feeling better. Denies abdominal pain and rates back pain now 2/10. She reports continued "being tired of being pregnant". Pt understands POC and to return for second BMZ injection.   Today's evaluation included a work-up for preterm labor which can be life-threatening for both mom and baby.  Assessment: 1. Pain of round ligament during pregnancy   2. Back pain affecting pregnancy in third trimester   3. History of preterm delivery, currently pregnant, third trimester     Plan: Discharge home Continue prenatal vitamins and 17-hydroxyprogesterone.   Return to MAU tomorrow for second BMZ  injection  Recommended pregnancy belt and supportive care (tylenol, hot packets, etc) for back pain  Rx sent to pharmacy on file for maternity support belt  Preterm Labor precautions and fetal kick counts   Allergies as of 02/16/2017      Reactions   Latex Other (See Comments)   irritation   Cefaclor Rash      Medication List    TAKE these medications   COMFORT FIT MATERNITY SUPP LG Misc 1 Device by Does not apply route daily.   hydroxyprogesterone caproate 250 mg/mL Oil injection Commonly known as:  MAKENA Inject 250 mg into the muscle once a week.   prenatal multivitamin Tabs tablet Take 1 tablet by mouth daily at 12 noon.       Clare Charon, MS3 02/16/2017 11:25 AM   I confirm that I have verified the information documented in the medical student's note and that I have also personally reperformed the physical exam and all medical decision making activities.  Sharyon Cable, CNM 02/16/17, 11:26 AM

## 2017-02-16 NOTE — MAU Note (Signed)
Urine in lab with Culture tube

## 2017-02-26 ENCOUNTER — Encounter (HOSPITAL_COMMUNITY): Payer: Self-pay

## 2017-02-26 ENCOUNTER — Inpatient Hospital Stay (HOSPITAL_COMMUNITY)
Admission: AD | Admit: 2017-02-26 | Discharge: 2017-02-26 | Disposition: A | Discharge status: Home / Self Care | Payer: Medicaid Other | Source: Ambulatory Visit | Attending: Obstetrics & Gynecology | Admitting: Obstetrics & Gynecology

## 2017-02-26 DIAGNOSIS — Z3A36 36 weeks gestation of pregnancy: Secondary | ICD-10-CM | POA: Diagnosis not present

## 2017-02-26 DIAGNOSIS — O4703 False labor before 37 completed weeks of gestation, third trimester: Secondary | ICD-10-CM | POA: Insufficient documentation

## 2017-02-26 DIAGNOSIS — O479 False labor, unspecified: Secondary | ICD-10-CM

## 2017-02-26 LAB — URINALYSIS, ROUTINE W REFLEX MICROSCOPIC
BILIRUBIN URINE: NEGATIVE
GLUCOSE, UA: NEGATIVE mg/dL
HGB URINE DIPSTICK: NEGATIVE
KETONES UR: NEGATIVE mg/dL
NITRITE: NEGATIVE
PH: 7 (ref 5.0–8.0)
PROTEIN: NEGATIVE mg/dL
Specific Gravity, Urine: 1.008 (ref 1.005–1.030)

## 2017-02-26 NOTE — Progress Notes (Signed)
NST:  Baseline: 140 bpm, Variability: Good {> 6 bpm), Accelerations: Reactive and Decelerations: Absent  MSE performed. Reactive NST. Will send msg to [facility] for prenatal care.  Judeth HornLawrence, Diella Gillingham, NP

## 2017-02-26 NOTE — Discharge Instructions (Signed)
Braxton Hicks Contractions °Contractions of the uterus can occur throughout pregnancy, but they are not always a sign that you are in labor. You may have practice contractions called Braxton Hicks contractions. These false labor contractions are sometimes confused with true labor. °What are Braxton Hicks contractions? °Braxton Hicks contractions are tightening movements that occur in the muscles of the uterus before labor. Unlike true labor contractions, these contractions do not result in opening (dilation) and thinning of the cervix. Toward the end of pregnancy (32-34 weeks), Braxton Hicks contractions can happen more often and may become stronger. These contractions are sometimes difficult to tell apart from true labor because they can be very uncomfortable. You should not feel embarrassed if you go to the hospital with false labor. °Sometimes, the only way to tell if you are in true labor is for your health care provider to look for changes in the cervix. The health care provider will do a physical exam and may monitor your contractions. If you are not in true labor, the exam should show that your cervix is not dilating and your water has not broken. °If there are other health problems associated with your pregnancy, it is completely safe for you to be sent home with false labor. You may continue to have Braxton Hicks contractions until you go into true labor. °How to tell the difference between true labor and false labor °True labor °· Contractions last 30-70 seconds. °· Contractions become very regular. °· Discomfort is usually felt in the top of the uterus, and it spreads to the lower abdomen and low back. °· Contractions do not go away with walking. °· Contractions usually become more intense and increase in frequency. °· The cervix dilates and gets thinner. °False labor °· Contractions are usually shorter and not as strong as true labor contractions. °· Contractions are usually irregular. °· Contractions  are often felt in the front of the lower abdomen and in the groin. °· Contractions may go away when you walk around or change positions while lying down. °· Contractions get weaker and are shorter-lasting as time goes on. °· The cervix usually does not dilate or become thin. °Follow these instructions at home: °· Take over-the-counter and prescription medicines only as told by your health care provider. °· Keep up with your usual exercises and follow other instructions from your health care provider. °· Eat and drink lightly if you think you are going into labor. °· If Braxton Hicks contractions are making you uncomfortable: °? Change your position from lying down or resting to walking, or change from walking to resting. °? Sit and rest in a tub of warm water. °? Drink enough fluid to keep your urine pale yellow. Dehydration may cause these contractions. °? Do slow and deep breathing several times an hour. °· Keep all follow-up prenatal visits as told by your health care provider. This is important. °Contact a health care provider if: °· You have a fever. °· You have continuous pain in your abdomen. °Get help right away if: °· Your contractions become stronger, more regular, and closer together. °· You have fluid leaking or gushing from your vagina. °· You pass blood-tinged mucus (bloody show). °· You have bleeding from your vagina. °· You have low back pain that you never had before. °· You feel your baby’s head pushing down and causing pelvic pressure. °· Your baby is not moving inside you as much as it used to. °Summary °· Contractions that occur before labor are called Braxton   Hicks contractions, false labor, or practice contractions. °· Braxton Hicks contractions are usually shorter, weaker, farther apart, and less regular than true labor contractions. True labor contractions usually become progressively stronger and regular and they become more frequent. °· Manage discomfort from Braxton Hicks contractions by  changing position, resting in a warm bath, drinking plenty of water, or practicing deep breathing. °This information is not intended to replace advice given to you by your health care provider. Make sure you discuss any questions you have with your health care provider. °Document Released: 05/06/2016 Document Revised: 05/06/2016 Document Reviewed: 05/06/2016 °Elsevier Interactive Patient Education © 2018 [employer] ° °Fetal Movement Counts °Patient Name: ________________________________________________ Patient Due Date: ____________________ °What is a fetal movement count? °A fetal movement count is the number of times that you feel your baby move during a certain amount of time. This may also be called a fetal kick count. A fetal movement count is recommended for every pregnant woman. You may be asked to start counting fetal movements as early as week 28 of your pregnancy. °Pay attention to when your baby is most active. You may notice your baby's sleep and wake cycles. You may also notice things that make your baby move more. You should do a fetal movement count: °· When your baby is normally most active. °· At the same time each day. ° °A good time to count movements is while you are resting, after having something to eat and drink. °How do I count fetal movements? °1. Find a quiet, comfortable area. Sit, or lie down on your side. °2. Write down the date, the start time and stop time, and the number of movements that you felt between those two times. Take this information with you to your health care visits. °3. For 2 hours, count kicks, flutters, swishes, rolls, and jabs. You should feel at least 10 movements during 2 hours. °4. You may stop counting after you have felt 10 movements. °5. If you do not feel 10 movements in 2 hours, have something to eat and drink. Then, keep resting and counting for 1 hour. If you feel at least 4 movements during that hour, you may stop counting. °Contact a health care  provider if: °· You feel fewer than 4 movements in 2 hours. °· Your baby is not moving like he or she usually does. °Date: ____________ Start time: ____________ Stop time: ____________ Movements: ____________ °Date: ____________ Start time: ____________ Stop time: ____________ Movements: ____________ °Date: ____________ Start time: ____________ Stop time: ____________ Movements: ____________ °Date: ____________ Start time: ____________ Stop time: ____________ Movements: ____________ °Date: ____________ Start time: ____________ Stop time: ____________ Movements: ____________ °Date: ____________ Start time: ____________ Stop time: ____________ Movements: ____________ °Date: ____________ Start time: ____________ Stop time: ____________ Movements: ____________ °Date: ____________ Start time: ____________ Stop time: ____________ Movements: ____________ °Date: ____________ Start time: ____________ Stop time: ____________ Movements: ____________ °This information is not intended to replace advice given to you by your health care provider. Make sure you discuss any questions you have with your health care provider. °Document Released: 01/20/2006 Document Revised: 08/20/2015 Document Reviewed: 01/30/2015 °Elsevier Interactive Patient Education © 2018 [employer] ° °

## 2017-02-26 NOTE — MAU Note (Signed)
Intermittent cramping since yesterday evening, some are very painful.  No vag bleeding, some yellow clumpy discharge. No LOF, +FM

## 2017-03-02 ENCOUNTER — Telehealth: Payer: Self-pay | Admitting: General Practice

## 2017-03-02 NOTE — Telephone Encounter (Signed)
Called and notified patient of appointment on 03/14/17 at 9:55am.  Patient voiced understanding.

## 2017-03-14 ENCOUNTER — Encounter: Payer: Medicaid Other | Admitting: Obstetrics & Gynecology

## 2017-06-13 ENCOUNTER — Encounter (HOSPITAL_COMMUNITY): Payer: Self-pay

## 2018-12-06 ENCOUNTER — Other Ambulatory Visit: Payer: Self-pay

## 2018-12-06 DIAGNOSIS — Z20822 Contact with and (suspected) exposure to covid-19: Secondary | ICD-10-CM

## 2018-12-09 ENCOUNTER — Encounter: Payer: Self-pay | Admitting: Family

## 2018-12-09 ENCOUNTER — Telehealth: Payer: Medicaid Other | Admitting: Family

## 2018-12-09 ENCOUNTER — Ambulatory Visit (HOSPITAL_COMMUNITY)
Admission: EM | Admit: 2018-12-09 | Discharge: 2018-12-09 | Disposition: A | Discharge status: Home / Self Care | Payer: BC Managed Care – PPO | Attending: Urgent Care | Admitting: Urgent Care

## 2018-12-09 ENCOUNTER — Other Ambulatory Visit: Payer: Self-pay

## 2018-12-09 ENCOUNTER — Encounter (HOSPITAL_COMMUNITY): Payer: Self-pay | Admitting: Urgent Care

## 2018-12-09 DIAGNOSIS — R059 Cough, unspecified: Secondary | ICD-10-CM

## 2018-12-09 DIAGNOSIS — R0981 Nasal congestion: Secondary | ICD-10-CM

## 2018-12-09 DIAGNOSIS — J069 Acute upper respiratory infection, unspecified: Secondary | ICD-10-CM

## 2018-12-09 DIAGNOSIS — R05 Cough: Secondary | ICD-10-CM

## 2018-12-09 LAB — NOVEL CORONAVIRUS, NAA: SARS-CoV-2, NAA: NOT DETECTED

## 2018-12-09 MED ORDER — PROMETHAZINE-DM 6.25-15 MG/5ML PO SYRP: 5.0000 mL | ORAL_SOLUTION | Freq: Every evening | ORAL | 0 refills | Status: DC | PRN

## 2018-12-09 MED ORDER — BENZONATATE 100 MG PO CAPS: 100.0000 mg | ORAL_CAPSULE | Freq: Three times a day (TID) | ORAL | 0 refills | Status: DC | PRN

## 2018-12-09 MED ORDER — PREDNISONE 10 MG (21) PO TBPK: ORAL_TABLET | ORAL | 0 refills | Status: DC

## 2018-12-09 NOTE — ED Triage Notes (Signed)
Pt reports has been having cough and nasal congestion since 11/29/18.  Was tested for Covid 3 days ago with negative result.  Pt states employer requiring a note in order to extend her self isolation the full 14 days.

## 2018-12-09 NOTE — ED Provider Notes (Signed)
[place]   MRN: [id number] DOB: 1990-06-01  Subjective:   Amber Good is a 28 y.o. female presenting for an extension of her work note, school note.  Patient has been symptomatic since 12/03/2018.  She has tested negative for Covid but still has cough and stuffy runny nose.  She has tried over-the-counter medications with minimal relief.  She does state that she feels like she is mildly improving but wants to take precaution given that she has exposure to the general public and does not want to get anyone sick should she have a false negative COVID-19 test.  Takes a multi-vitamin daily.   Allergies  Allergen Reactions  . Latex Other (See Comments)    irritation  . Cefaclor Rash    Past Medical History:  Diagnosis Date  . Chlamydia 2012  . Headache   . Ovarian cyst   . Preterm labor   . Round ligament pain 02/16/2017     Past Surgical History:  Procedure Laterality Date  . DILATION AND CURETTAGE OF UTERUS    . DILATION AND EVACUATION N/A 09/14/2013   Procedure: DILATATION AND EVACUATION;  Surgeon: Betsy Coder, MD;  Location: [facility];  Service: Gynecology;  Laterality: N/A;  . LIPOSUCTION AUOLOGOUS FAT TRANSFER TO BUTTOCKS      Family History  Problem Relation Age of Onset  . Cancer Maternal Grandmother   . Diabetes Maternal Grandmother   . Heart disease Maternal Grandmother   . Liver disease Maternal Grandfather   . Cancer Maternal Aunt   . Cancer Paternal Aunt   . Cancer Paternal Grandmother     Social History   Tobacco Use  . Smoking status: Never Smoker  . Smokeless tobacco: Never Used  Substance Use Topics  . Alcohol use: Not Currently  . Drug use: No    ROS   Objective:   Vitals: BP (!) 112/58   Pulse 62   Temp 98.1 F (36.7 C) (Oral)   Resp 16   LMP 11/25/2018 (Exact Date)   SpO2 100%   Breastfeeding No   Physical Exam Constitutional:      General: She is not in acute distress.    Appearance: Normal appearance. She  is well-developed. She is not ill-appearing, toxic-appearing or diaphoretic.  HENT:     Head: Normocephalic and atraumatic.     Right Ear: Tympanic membrane and ear canal normal. No drainage or tenderness. No middle ear effusion. Tympanic membrane is not erythematous.     Left Ear: Tympanic membrane and ear canal normal. No drainage or tenderness.  No middle ear effusion. Tympanic membrane is not erythematous.     Nose: Nose normal. No congestion or rhinorrhea.     Mouth/Throat:     Mouth: Mucous membranes are moist. No oral lesions.     Pharynx: No pharyngeal swelling, oropharyngeal exudate, posterior oropharyngeal erythema or uvula swelling.     Tonsils: No tonsillar exudate or tonsillar abscesses.     Comments: Significant pnd. Eyes:     Extraocular Movements: Extraocular movements intact.     Right eye: Normal extraocular motion.     Left eye: Normal extraocular motion.     Conjunctiva/sclera: Conjunctivae normal.     Pupils: Pupils are equal, round, and reactive to light.  Neck:     Musculoskeletal: Normal range of motion and neck supple.  Cardiovascular:     Rate and Rhythm: Normal rate and regular rhythm.     Pulses: Normal pulses.     Heart  sounds: Normal heart sounds. No murmur. No friction rub. No gallop.   Pulmonary:     Effort: Pulmonary effort is normal. No respiratory distress.     Breath sounds: Normal breath sounds. No stridor. No wheezing, rhonchi or rales.  Lymphadenopathy:     Cervical: No cervical adenopathy.  Skin:    General: Skin is warm and dry.     Findings: No rash.  Neurological:     General: No focal deficit present.     Mental Status: She is alert and oriented to person, place, and time.  Psychiatric:        Mood and Affect: Mood normal.        Behavior: Behavior normal.        Thought Content: Thought content normal.        Judgment: Judgment normal.      Assessment and Plan :   1. Viral URI with cough   2. Cough   3. Sinus congestion      Extended work note to 12/17/2018. COVID-19 testing was negative.  Counseled that we will manage conservatively with supportive care for viral illness.  I emphasized that we do not complete FMLA/disability given the duration of time she is requesting from work.  Patient verbalized understanding. Counseled patient on potential for adverse effects with medications prescribed/recommended today, ER and return-to-clinic precautions discussed, patient verbalized understanding.    Wallis Bamberg, PA-C 12/09/18 1511

## 2018-12-09 NOTE — Progress Notes (Signed)
We are sorry that you are not feeling well.  Here is how we plan to help!  Based on your presentation I believe you most likely have A cough due to a virus.  This is called viral bronchitis and is best treated by rest, plenty of fluids and control of the cough.  You may use Ibuprofen or Tylenol as directed to help your symptoms.     In addition you may use A non-prescription cough medication called Mucinex DM: take 2 tablets every 12 hours.  Prednisone 10 mg daily for 6 days (see taper instructions below)  Directions for 6 day taper: Day 1: 2 tablets before breakfast, 1 after both lunch & dinner and 2 at bedtime Day 2: 1 tab before breakfast, 1 after both lunch & dinner and 2 at bedtime Day 3: 1 tab at each meal & 1 at bedtime Day 4: 1 tab at breakfast, 1 at lunch, 1 at bedtime Day 5: 1 tab at breakfast & 1 tab at bedtime Day 6: 1 tab at breakfast   From your responses in the eVisit questionnaire you describe inflammation in the upper respiratory tract which is causing a significant cough.  This is commonly called Bronchitis and has four common causes:    Allergies  Viral Infections  Acid Reflux  Bacterial Infection Allergies, viruses and acid reflux are treated by controlling symptoms or eliminating the cause. An example might be a cough caused by taking certain blood pressure medications. You stop the cough by changing the medication. Another example might be a cough caused by acid reflux. Controlling the reflux helps control the cough.  USE OF BRONCHODILATOR ("RESCUE") INHALERS: There is a risk from using your bronchodilator too frequently.  The risk is that over-reliance on a medication which only relaxes the muscles surrounding the breathing tubes can reduce the effectiveness of medications prescribed to reduce swelling and congestion of the tubes themselves.  Although you feel brief relief from the bronchodilator inhaler, your asthma may actually be worsening with the tubes  becoming more swollen and filled with mucus.  This can delay other crucial treatments, such as oral steroid medications. If you need to use a bronchodilator inhaler daily, several times per day, you should discuss this with your provider.  There are probably better treatments that could be used to keep your asthma under control.     HOME CARE . Only take medications as instructed by your medical team. . Complete the entire course of an antibiotic. . Drink plenty of fluids and get plenty of rest. . Avoid close contacts especially the very young and the elderly . Cover your mouth if you cough or cough into your sleeve. . Always remember to wash your hands . A steam or ultrasonic humidifier can help congestion.   GET HELP RIGHT AWAY IF: . You develop worsening fever. . You become short of breath . You cough up blood. . Your symptoms persist after you have completed your treatment plan MAKE SURE YOU   Understand these instructions.  Will watch your condition.  Will get help right away if you are not doing well or get worse.  Your e-visit answers were reviewed by a board certified advanced clinical practitioner to complete your personal care plan.  Depending on the condition, your plan could have included both over the counter or prescription medications. If there is a problem please reply  once you have received a response from your provider. Your safety is important to us.  If you   have drug allergies check your prescription carefully.    You can use MyChart to ask questions about today's visit, request a non-urgent call back, or ask for a work or school excuse for 24 hours related to this e-Visit. If it has been greater than 24 hours you will need to follow up with your provider, or enter a new e-Visit to address those concerns. You will get an e-mail in the next two days asking about your experience.  I hope that your e-visit has been valuable and will speed your recovery. Thank you for  using e-visits.   Greater than 5 minutes, yet less than 10 minutes of time have been spent researching, coordinating, and implementing care for this patient today.  Thank you for the details you included in the comment boxes. Those details are very helpful in determining the best course of treatment for you and help us to provide the best care.  

## 2018-12-09 NOTE — Discharge Instructions (Addendum)
We will manage this as a viral syndrome. For sore throat or cough try using a honey-based tea. Use 3 teaspoons of honey with juice squeezed from half lemon. Place shaved pieces of ginger into 1/2-1 cup of water and warm over stove top. Then mix the ingredients and repeat every 4 hours as needed. Please take Tylenol 500mg every 6 hours. Hydrate very well with at least 2 liters of water. Eat light meals such as soups to replenish electrolytes and soft fruits, veggies. Start an antihistamine like Zyrtec, Allegra or Claritin for postnasal drainage, sinus congestion.  You can take this together with pseudoephedrine (Sudafed) at a dose of 60 mg 3 times a day or 120 mg twice daily as needed for the same kind of congestion.    

## 2019-01-12 IMAGING — US US OB TRANSVAGINAL
1 series · 15 of 28 positions shown · non-contrast
Comparison: None for this gestation

CLINICAL DATA: Abdominal pain during pregnancy

EXAM:
OBSTETRIC <14 WK US AND TRANSVAGINAL OB US
TECHNIQUE: Both transabdominal and transvaginal ultrasound examinations were
performed for complete evaluation of the gestation as well as the
maternal uterus, adnexal regions, and pelvic cul-de-sac.
Transvaginal technique was performed to assess early pregnancy.

[Series 1: us ob transvaginal · 15 of 74 slices shown]
[im 1/74]
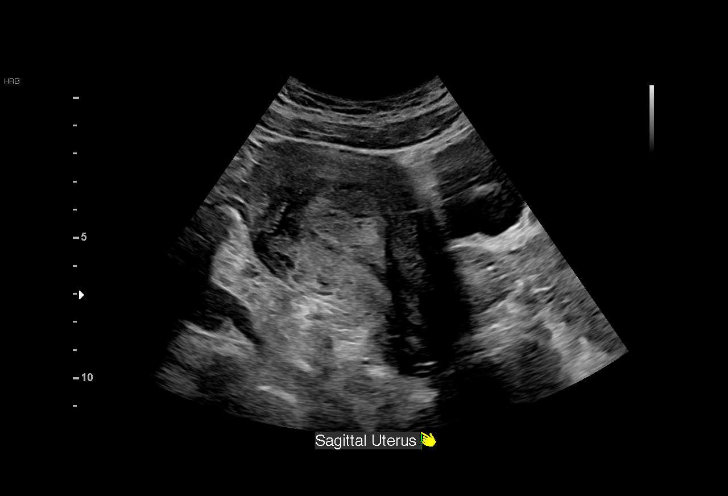
[im 6/74]
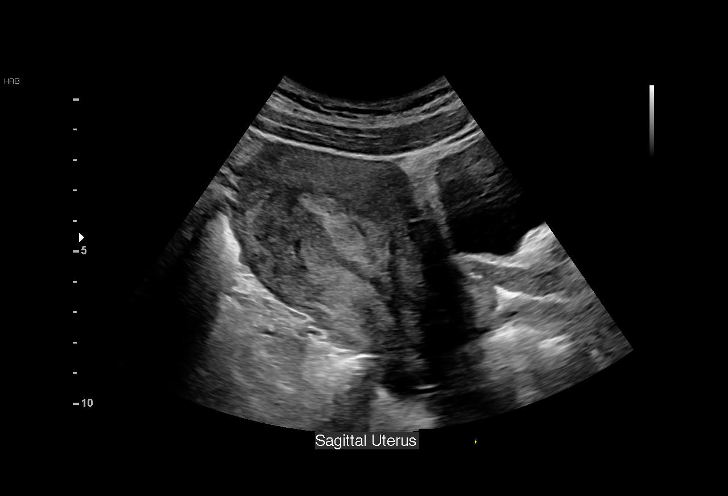
[im 11/74]
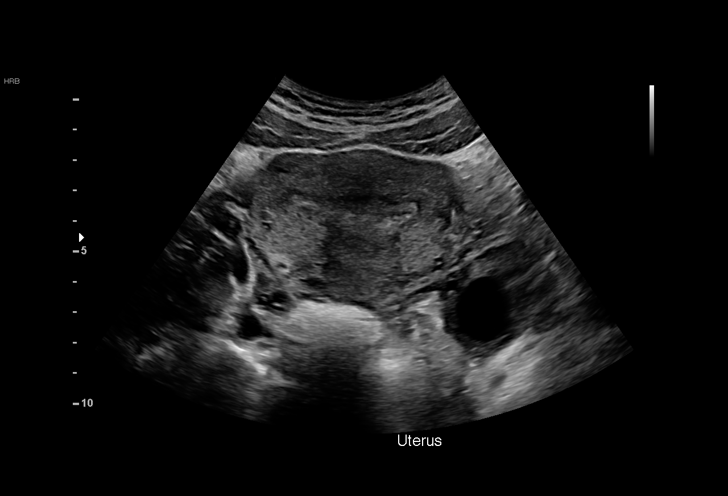
[im 17/74]
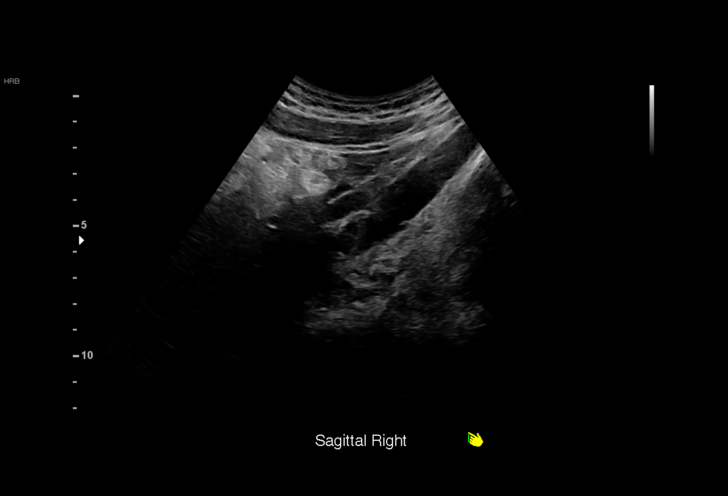
[im 22/74]
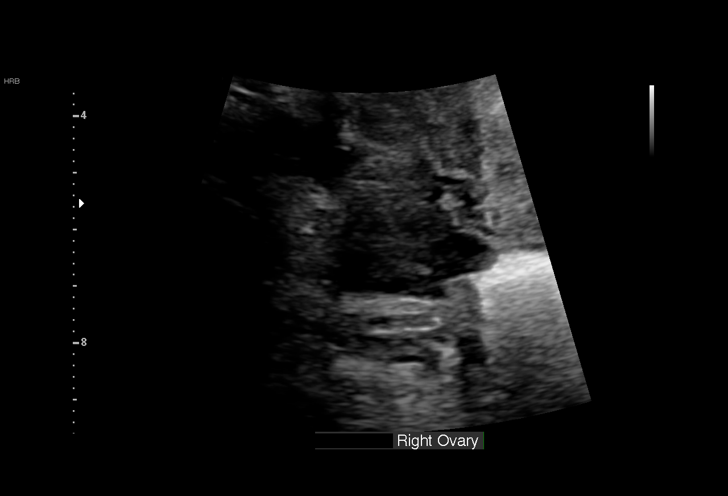
[im 28/74]
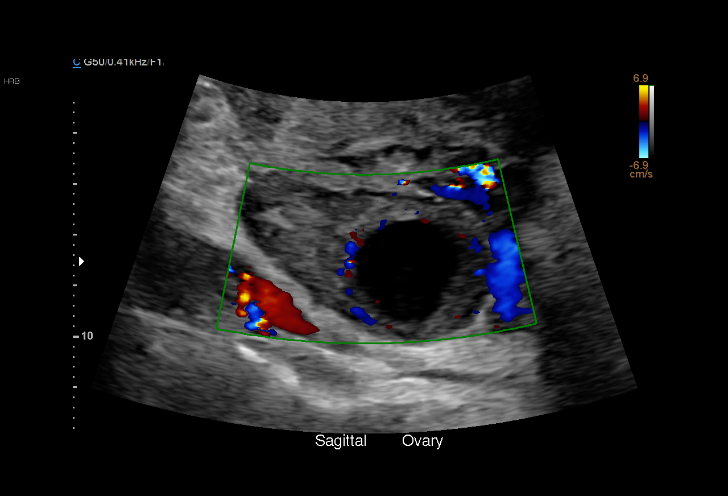
[im 33/74]
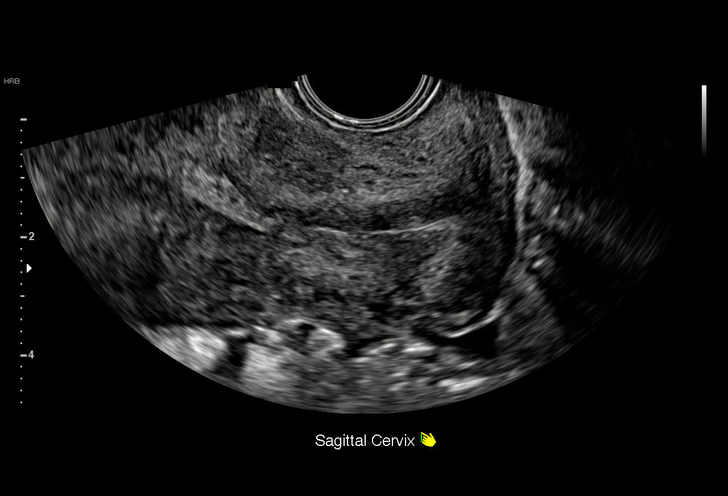
[im 38/74]
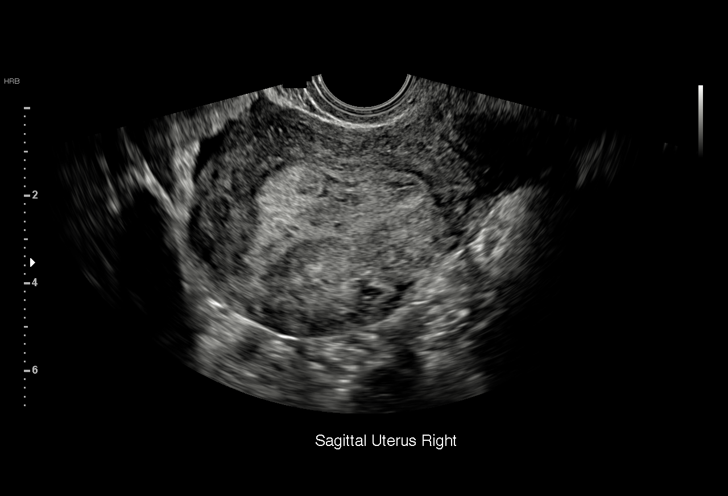
[im 41/74]
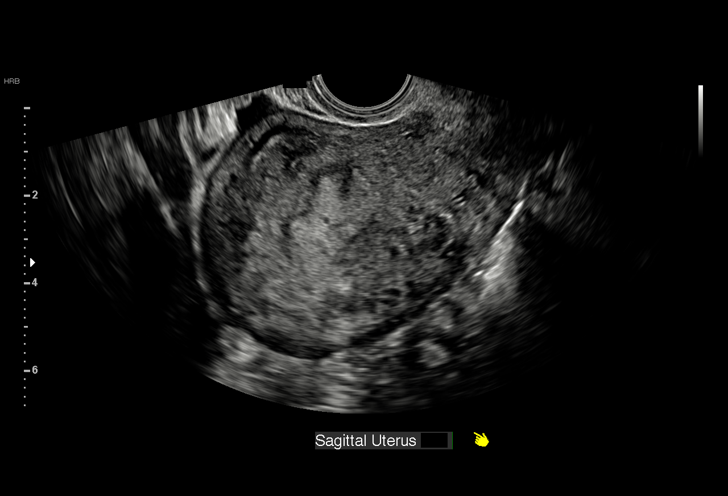
[im 46/74]
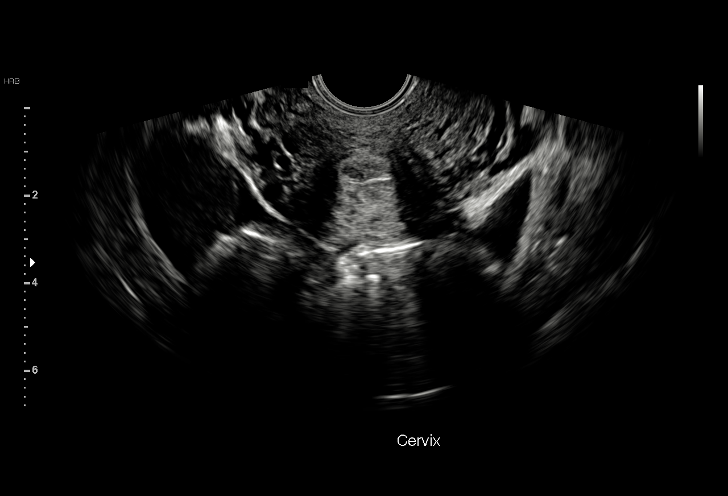
[im 52/74]
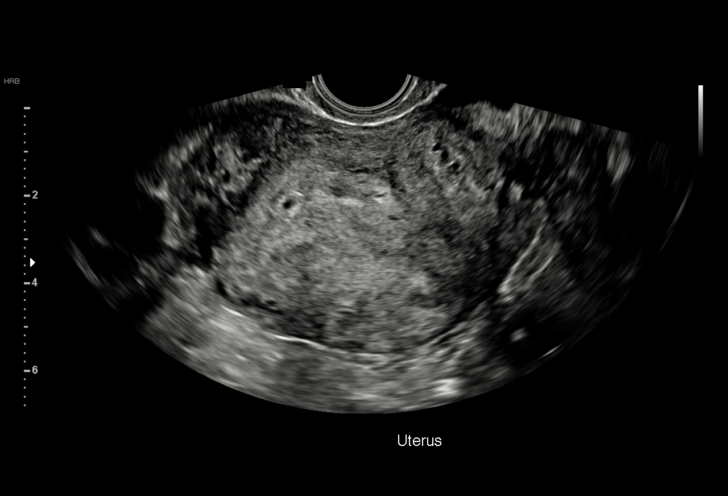
[im 57/74]
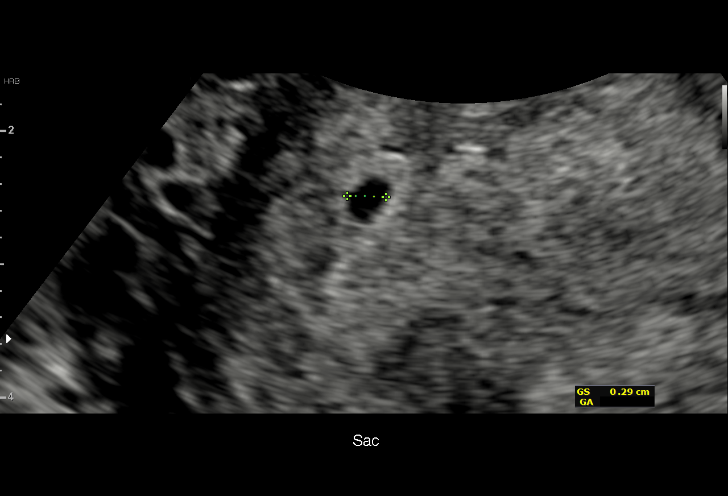
[im 63/74]
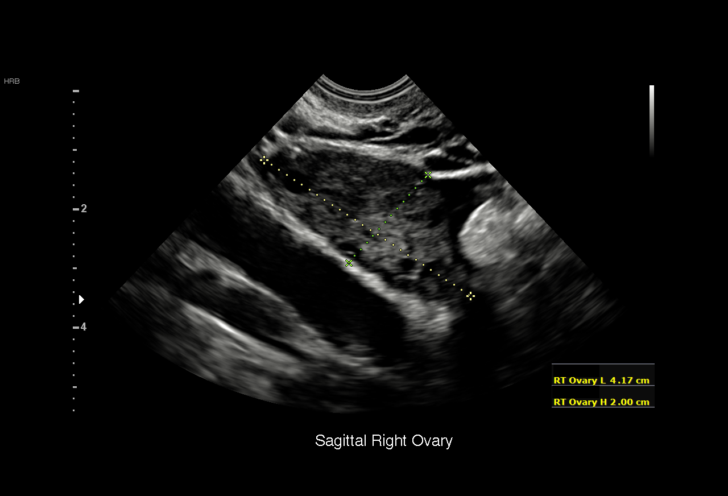
[im 68/74]
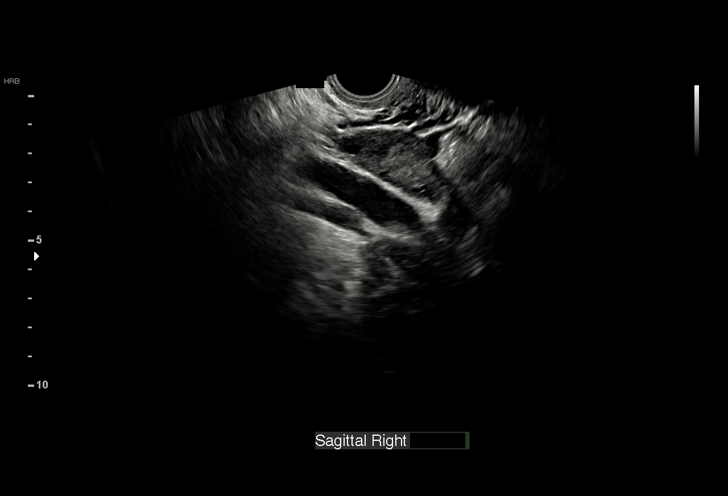
[im 74/74]
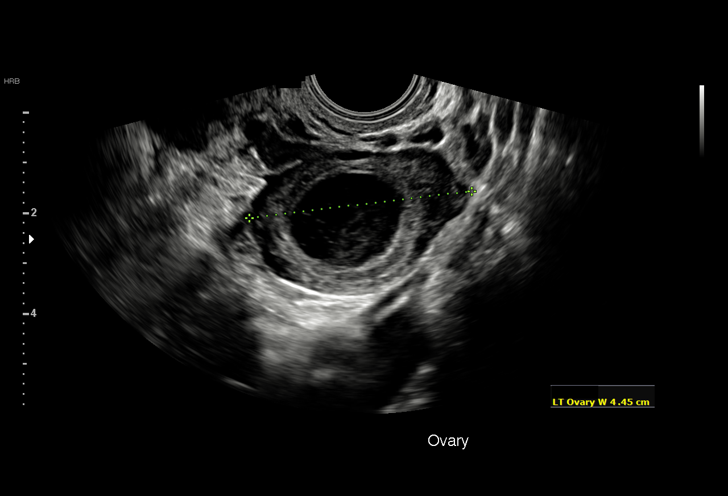

[15 of 28 positions shown; findings below may reference images not displayed]

FINDINGS: Intrauterine gestational sac: Present

Yolk sac:  Absent

Embryo:  Absent

Cardiac Activity: N/A

Heart Rate: N/A  bpm

MSD: 3.0  mm   4 w   6  d

Subchorionic hemorrhage:  None visualized.

Maternal uterus/adnexae:

RIGHT ovary normal size and morphology, 3.8 x 2.1 x 2.6 cm.

LEFT ovary measures 5.2 x 3.0 x 3.6 cm and contains a complicated
question hemorrhagic corpus luteum.

Trace free pelvic fluid.

No adnexal masses.
IMPRESSION: Tiny gestational sac within the uterus without visualization of a
yolk sac or fetal pole to establish viability.

Otherwise negative exam.

## 2019-03-08 ENCOUNTER — Ambulatory Visit (HOSPITAL_COMMUNITY)
Admission: EM | Admit: 2019-03-08 | Discharge: 2019-03-08 | Disposition: A | Discharge status: Home / Self Care | Payer: BC Managed Care – PPO | Attending: Urgent Care | Admitting: Urgent Care

## 2019-03-08 ENCOUNTER — Encounter (HOSPITAL_COMMUNITY): Payer: Self-pay

## 2019-03-08 ENCOUNTER — Other Ambulatory Visit: Payer: Self-pay

## 2019-03-08 DIAGNOSIS — Z0189 Encounter for other specified special examinations: Secondary | ICD-10-CM | POA: Diagnosis present

## 2019-03-08 DIAGNOSIS — Z8619 Personal history of other infectious and parasitic diseases: Secondary | ICD-10-CM | POA: Insufficient documentation

## 2019-03-08 NOTE — ED Triage Notes (Signed)
Pt state she has got her Birth control removed on 03/03/19. Pt state she was told that her need s to get teste for herpes. ( blood work )

## 2019-03-08 NOTE — ED Provider Notes (Signed)
[facility]   MRN: [id number] DOB: Sep 21, 1990  Subjective:   Amber Good is a 29 y.o. female presenting for blood work.  Patient states that at the end of February she had some mild genital irritation/discomfort.  She went to a different urgent care and they performed an STI screen.  She states they did a cervical swab and she tested positive for yeast, BV.  She states she has had these infections before and were not concerning to her.  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  They recommended that she be checked at a different clinic and specifically have blood work done.  Patient has a gynecologist that is managing her contraception, is using NuvaRing.  She plans on setting up a follow-up visit with them.  Denies having any symptoms currently or ever having any kind of painful genital blisters or rash.  Denies fever, nausea, vomiting, belly or pelvic pain, dysuria, hematuria, vaginal discharge.  No current facility-administered medications for this encounter.  Current Outpatient Medications:  .  benzonatate (TESSALON) 100 MG capsule, Take 1-2 capsules (100-200 mg total) by mouth 3 (three) times daily as needed. (Patient not taking: Reported on 03/08/2019), Disp: 60 capsule, Rfl: 0 .  promethazine-dextromethorphan (PROMETHAZINE-DM) 6.25-15 MG/5ML syrup, Take 5 mLs by mouth at bedtime as needed for cough. (Patient not taking: Reported on 03/08/2019), Disp: 100 mL, Rfl: 0   Allergies  Allergen Reactions  . Latex Other (See Comments)    irritation  . Cefaclor Rash    Past Medical History:  Diagnosis Date  . Chlamydia 2012  . Headache   . Ovarian cyst   . Preterm labor   . Round ligament pain 02/16/2017     Past Surgical History:  Procedure Laterality Date  . DILATION AND CURETTAGE OF UTERUS    . DILATION AND EVACUATION N/A 09/14/2013   Procedure: DILATATION AND EVACUATION;  Surgeon: Michael Litter, MD;  Location: [facility];  Service:  Gynecology;  Laterality: N/A;  . LIPOSUCTION AUOLOGOUS FAT TRANSFER TO BUTTOCKS      Family History  Problem Relation Age of Onset  . Cancer Maternal Grandmother   . Diabetes Maternal Grandmother   . Heart disease Maternal Grandmother   . Liver disease Maternal Grandfather   . Cancer Maternal Aunt   . Cancer Paternal Aunt   . Cancer Paternal Grandmother     Social History   Tobacco Use  . Smoking status: Never Smoker  . Smokeless tobacco: Never Used  Substance Use Topics  . Alcohol use: Not Currently  . Drug use: No    ROS   Objective:   Vitals: BP 118/77 (BP Location: Right Arm)   Pulse 79   Temp 98.1 F (36.7 C) (Oral)   Resp 18   Wt 170 lb (77.1 kg)   LMP 03/07/2019   SpO2 98%   BMI 30.11 kg/m   Physical Exam Constitutional:      General: She is not in acute distress.    Appearance: Normal appearance. She is well-developed. She is not ill-appearing, toxic-appearing or diaphoretic.  HENT:     Head: Normocephalic and atraumatic.     Nose: Nose normal.     Mouth/Throat:     Mouth: Mucous membranes are moist.     Pharynx: Oropharynx is clear.  Eyes:     General: No scleral icterus.    Extraocular Movements: Extraocular movements intact.     Pupils: Pupils are equal, round, and reactive to light.  Cardiovascular:     Rate and Rhythm: Normal rate.  Pulmonary:     Effort: Pulmonary effort is normal.  Skin:    General: Skin is warm and dry.  Neurological:     General: No focal deficit present.     Mental Status: She is alert and oriented to person, place, and time.  Psychiatric:        Mood and Affect: Mood normal.        Behavior: Behavior normal.        Thought Content: Thought content normal.        Judgment: Judgment normal.      Assessment and Plan :   1. Encounter for laboratory test     Discussed with patient the implications/interpretation of blood work for herpes.  However, given that another clinic tested her throat swab and results  were positive for herpes she wants to make sure she gets this kind of blood work.  Emphasized need for follow-up with her gynecologist for further discussion on this kind of testing.  Otherwise, continue current regimen and contraception. Counseled patient on potential for adverse effects with medications prescribed/recommended today, ER and return-to-clinic precautions discussed, patient verbalized understanding.    Jaynee Eagles, PA-C 03/08/19 1431

## 2019-03-08 NOTE — Discharge Instructions (Signed)
Please make sure that you follow-up with your gynecologist for further discussion and work-up to rule out genital herpes.  We will be using the antibody test today at your request but want to emphasize that this is not specific for determining whether or not you have genital herpes.  The CDC recommends having cultures done if you have a painful genital rash.  This is a much more specific test for you as we discussed.  However, we will let you know about your results as soon as they are available.

## 2019-03-09 ENCOUNTER — Telehealth (HOSPITAL_COMMUNITY): Payer: Self-pay | Admitting: Emergency Medicine

## 2019-03-09 LAB — HSV 1 ANTIBODY, IGG: HSV 1 Glycoprotein G Ab, IgG: <0.91 index (ref 0.00–0.90)

## 2019-03-09 LAB — HSV-2 IGG SUPPLEMENTAL TEST: HSV-2 IgG Supplemental Test: POSITIVE — AB

## 2019-03-09 LAB — HSV 2 ANTIBODY, IGG: HSV 2 Glycoprotein G Ab, IgG: 1.59 index — ABNORMAL HIGH (ref 0.00–0.90)

## 2019-03-09 NOTE — Telephone Encounter (Signed)
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 Patient contacted by phone and made aware of    results. Pt verbalized understanding and had all questions answered.

## 2019-03-24 ENCOUNTER — Other Ambulatory Visit: Payer: Self-pay

## 2019-03-24 ENCOUNTER — Ambulatory Visit
Admission: EM | Admit: 2019-03-24 | Discharge: 2019-03-24 | Disposition: A | Discharge status: Home / Self Care | Payer: BC Managed Care – PPO | Attending: Physician Assistant | Admitting: Physician Assistant

## 2019-03-24 ENCOUNTER — Telehealth: Payer: BC Managed Care – PPO

## 2019-03-24 DIAGNOSIS — Z20822 Contact with and (suspected) exposure to covid-19: Secondary | ICD-10-CM | POA: Diagnosis not present

## 2019-03-24 NOTE — Discharge Instructions (Signed)
COVID testing ordered. As discussed, given your exposure, I would like you to quarantine for 14 days regardless of results. Monitor for any symptoms such as cough, congestion, shortness of breath, loss of taste/smell, fever, may need to extend quarantine time. Go to the emergency department for further evaluation if you develop significant shortness of breath, cannot speak in full sentences.    

## 2019-03-24 NOTE — ED Provider Notes (Signed)
[facility]    CSN: [id number] Arrival date & time: 03/24/19  1131      History   Chief Complaint Chief Complaint  Patient presents with  . Covid Test    HPI Amber Good is a 29 y.o. female.   29 year old female comes in for COVID testing after positive exposure 5 days ago. States family member tested positive for COVID. Patient remains asymptomatic. Denies URI symptoms such as cough, congestion, sore throat. Denies fever, chills, body aches. Denies abdominal pain, nausea, vomiting, diarrhea. Denies shortness of breath, loss of taste/smell. No antipyretics in the last 8 hours. However, work requires her to quarantine for 14 days due to exposure, and will need note to cover for that period.      Past Medical History:  Diagnosis Date  . Chlamydia 2012  . Headache   . Ovarian cyst   . Preterm labor   . Round ligament pain 02/16/2017    Patient Active Problem List   Diagnosis Date Noted  . Round ligament pain 02/16/2017  . 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 Preterm delivery 10/13/2010  . Preterm labor 10/11/2010  . Back pain complicating pregnancy 10/06/2010    Past Surgical History:  Procedure Laterality Date  . DILATION AND CURETTAGE OF UTERUS    . DILATION AND EVACUATION N/A 09/14/2013   Procedure: DILATATION AND EVACUATION;  Surgeon: Michael Litter, MD;  Location: [facility];  Service: Gynecology;  Laterality: N/A;  . LIPOSUCTION AUOLOGOUS FAT TRANSFER TO BUTTOCKS      OB History    Gravida  3   Para  1   Term      Preterm  1   AB  1   Living  1     SAB  1   TAB      Ectopic      Multiple      Live Births  1            Home Medications    Prior to Admission medications   Medication Sig Start Date End Date Taking? Authorizing Provider  valACYclovir (VALTREX) 1000 MG tablet Take 1,000 mg by mouth 2 (two) times daily.   Yes [provider]  benzonatate (TESSALON) 100 MG capsule Take 1-2 capsules  (100-200 mg total) by mouth 3 (three) times daily as needed. Patient not taking: Reported on 03/08/2019 12/09/18   Wallis Bamberg, PA-C  promethazine-dextromethorphan (PROMETHAZINE-DM) 6.25-15 MG/5ML syrup Take 5 mLs by mouth at bedtime as needed for cough. Patient not taking: Reported on 03/08/2019 12/09/18   Wallis Bamberg, PA-C    Family History Family History  Problem Relation Age of Onset  . Cancer Maternal Grandmother   . Diabetes Maternal Grandmother   . Heart disease Maternal Grandmother   . Liver disease Maternal Grandfather   . Cancer Maternal Aunt   . Cancer Paternal Aunt   . Cancer Paternal Grandmother     Social History Social History   Tobacco Use  . Smoking status: Never Smoker  . Smokeless tobacco: Never Used  Substance Use Topics  . Alcohol use: Not Currently  . Drug use: No     Allergies   Latex and Cefaclor   Review of Systems Review of Systems  Reason unable to perform ROS: See HPI as above.     Physical Exam Triage Vital Signs ED Triage Vitals  Enc Vitals Group     BP 03/24/19 1141 118/77     Pulse  Rate 03/24/19 1141 71     Resp 03/24/19 1141 16     Temp 03/24/19 1141 98.3 F (36.8 C)     Temp Source 03/24/19 1141 Oral     SpO2 03/24/19 1141 96 %     Weight --      Height --      Head Circumference --      Peak Flow --      Pain Score 03/24/19 1156 0     Pain Loc --      Pain Edu? --      Excl. in [place]? --    No data found.  Updated Vital Signs BP 118/77 (BP Location: Left Arm)   Pulse 71   Temp 98.3 F (36.8 C) (Oral)   Resp 16   LMP 02/27/2019   SpO2 96%   Physical Exam Constitutional:      General: She is not in acute distress.    Appearance: Normal appearance. She is well-developed. She is not toxic-appearing or diaphoretic.  HENT:     Head: Normocephalic and atraumatic.  Eyes:     Conjunctiva/sclera: Conjunctivae normal.     Pupils: Pupils are equal, round, and reactive to light.  Pulmonary:     Effort: Pulmonary effort is  normal. No respiratory distress.     Comments: Speaking in full sentences without difficulty Musculoskeletal:     Cervical back: Normal range of motion and neck supple.  Skin:    General: Skin is warm and dry.  Neurological:     Mental Status: She is alert and oriented to person, place, and time.      UC Treatments / Results  Labs (all labs ordered are listed, but only abnormal results are displayed) Labs Reviewed  NOVEL CORONAVIRUS, NAA    EKG   Radiology No results found.  Procedures Procedures (including critical care time)  Medications Ordered in UC Medications - No data to display  Initial Impression / Assessment and Plan / UC Course  I have reviewed the triage vital signs and the nursing notes.  Pertinent labs & imaging results that were available during my care of the patient were reviewed by me and considered in my medical decision making (see chart for details).    Discussed with patient, given already quarantining for 14 days, does not necessarily need testing.  Discussed if develops symptoms, current testing results is no longer valid.  Also discussed given exposure, if develops symptoms, will be presumed positive, and will need to extend quarantine.  Patient expresses understanding, and would like to continue with testing.  To quarantine as discussed, continue to monitor symptoms.  Return precautions given.  Patient expresses understanding agrees to plan.  Final Clinical Impressions(s) / UC Diagnoses   Final diagnoses:  Close exposure to COVID-19 virus   ED Prescriptions    None     PDMP not reviewed this encounter.   Ok Edwards, PA-C 03/24/19 1221

## 2019-03-24 NOTE — ED Triage Notes (Signed)
Patient is here for COVID testing after exposure to a relative who tested positive x 4 days ago. Patient is quarantining for work and needing a note.

## 2019-03-25 LAB — NOVEL CORONAVIRUS, NAA: SARS-CoV-2, NAA: NOT DETECTED

## 2019-05-14 ENCOUNTER — Emergency Department (HOSPITAL_COMMUNITY)
Admission: EM | Admit: 2019-05-14 | Discharge: 2019-05-15 | Discharge status: Against Medical Advice | Payer: BC Managed Care – PPO

## 2019-05-14 NOTE — ED Notes (Signed)
Pt checked out.

## 2019-08-06 ENCOUNTER — Other Ambulatory Visit: Payer: Self-pay

## 2019-08-06 ENCOUNTER — Ambulatory Visit (HOSPITAL_COMMUNITY)
Admission: RE | Admit: 2019-08-06 | Discharge: 2019-08-06 | Disposition: A | Discharge status: Home / Self Care | Payer: BC Managed Care – PPO | Source: Ambulatory Visit | Attending: Emergency Medicine | Admitting: Emergency Medicine

## 2019-08-06 ENCOUNTER — Encounter (HOSPITAL_COMMUNITY): Payer: Self-pay

## 2019-08-06 VITALS — BP 100/71 | HR 78 | Temp 98.2°F | Resp 18

## 2019-08-06 DIAGNOSIS — Z3202 Encounter for pregnancy test, result negative: Secondary | ICD-10-CM | POA: Diagnosis not present

## 2019-08-06 DIAGNOSIS — B009 Herpesviral infection, unspecified: Secondary | ICD-10-CM | POA: Diagnosis present

## 2019-08-06 DIAGNOSIS — N898 Other specified noninflammatory disorders of vagina: Secondary | ICD-10-CM | POA: Diagnosis present

## 2019-08-06 LAB — POC URINE PREG, ED: Preg Test, Ur: NEGATIVE

## 2019-08-06 MED ORDER — TERCONAZOLE 80 MG VA SUPP: 80.0000 mg | Freq: Every day | VAGINAL | 0 refills | Status: AC

## 2019-08-06 MED ORDER — VALACYCLOVIR HCL 1 G PO TABS: 1000.0000 mg | ORAL_TABLET | Freq: Two times a day (BID) | ORAL | 0 refills | Status: AC

## 2019-08-06 NOTE — ED Provider Notes (Signed)
[facility]    CSN: [id number] Arrival date & time: 08/06/19  1353      History   Chief Complaint Chief Complaint  Patient presents with  . Appointment    200  . Vaginal Discharge    HPI Amber Good is a 29 y.o. female presenting today for evaluation of vaginal irritation and discharge.  Patient reports over the past 3 days she has had increased irritation as well as white discharge.  Patient says that she has a history of HSV as well as recurrent yeast and unsure which is causing her symptoms.  She denies any pelvic pain.  Denies any nausea or vomiting.  Last menstrual cycle early July, typically has menstrual cycle at the beginning of the month and is due to start her cycle soon.  Denies any known rashes or lesions.  HPI  Past Medical History:  Diagnosis Date  . Chlamydia 2012  . Headache   . Ovarian cyst   . Preterm labor   . Round ligament pain 02/16/2017    Patient Active Problem List   Diagnosis Date Noted  . Round ligament pain 02/16/2017  . 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 Preterm delivery 10/13/2010  . Preterm labor 10/11/2010  . Back pain complicating pregnancy 10/06/2010    Past Surgical History:  Procedure Laterality Date  . DILATION AND CURETTAGE OF UTERUS    . DILATION AND EVACUATION N/A 09/14/2013   Procedure: DILATATION AND EVACUATION;  Surgeon: Michael Litter, MD;  Location: [facility];  Service: Gynecology;  Laterality: N/A;  . LIPOSUCTION AUOLOGOUS FAT TRANSFER TO BUTTOCKS      OB History    Gravida  3   Para  1   Term      Preterm  1   AB  1   Living  1     SAB  1   TAB      Ectopic      Multiple      Live Births  1            Home Medications    Prior to Admission medications   Medication Sig Start Date End Date Taking? Authorizing Provider  terconazole (TERAZOL 3) 80 MG vaginal suppository Place 1 suppository (80 mg total) vaginally at bedtime for 3 days. 08/06/19 08/09/19  Diavion Labrador,  Tim [place] C, PA-C  valACYclovir (VALTREX) 1000 MG tablet Take 1 tablet (1,000 mg total) by mouth 2 (two) times daily. 08/06/19   Alexsandra Shontz, Junius Creamer, PA-C    Family History Family History  Problem Relation Age of Onset  . Cancer Maternal Grandmother   . Diabetes Maternal Grandmother   . Heart disease Maternal Grandmother   . Liver disease Maternal Grandfather   . Cancer Maternal Aunt   . Cancer Paternal Aunt   . Cancer Paternal Grandmother     Social History Social History   Tobacco Use  . Smoking status: Never Smoker  . Smokeless tobacco: Never Used  Vaping Use  . Vaping Use: Never used  Substance Use Topics  . Alcohol use: Not Currently  . Drug use: No     Allergies   Latex and Cefaclor   Review of Systems Review of Systems  Constitutional: Negative for fever.  Respiratory: Negative for shortness of breath.   Cardiovascular: Negative for chest pain.  Gastrointestinal: Negative for abdominal pain, diarrhea, nausea and vomiting.  Genitourinary: Positive for vaginal discharge. Negative for dysuria, flank pain, genital sores, hematuria, menstrual problem,  vaginal bleeding and vaginal pain.  Musculoskeletal: Negative for back pain.  Skin: Negative for rash.  Neurological: Negative for dizziness, light-headedness and headaches.     Physical Exam Triage Vital Signs ED Triage Vitals  Enc Vitals Group     BP      Pulse      Resp      Temp      Temp src      SpO2      Weight      Height      Head Circumference      Peak Flow      Pain Score      Pain Loc      Pain Edu?      Excl. in [place]?    No data found.  Updated Vital Signs BP 100/71 (BP Location: Right Arm)   Pulse 78   Temp 98.2 F (36.8 C) (Oral)   Resp 18   SpO2 96%   Visual Acuity Right Eye Distance:   Left Eye Distance:   Bilateral Distance:    Right Eye Near:   Left Eye Near:    Bilateral Near:     Physical Exam Vitals and nursing note reviewed.  Constitutional:      Appearance: She is  well-developed.     Comments: No acute distress  HENT:     Head: Normocephalic and atraumatic.     Nose: Nose normal.  Eyes:     Conjunctiva/sclera: Conjunctivae normal.  Cardiovascular:     Rate and Rhythm: Normal rate.  Pulmonary:     Effort: Pulmonary effort is normal. No respiratory distress.  Abdominal:     General: There is no distension.  Genitourinary:    Comments: Small ulcerative appearing lesion noted superior to the urethra and below the clitoris, vagina with mild amount of thick white discharge as well as mild amount of watery thin discharge Musculoskeletal:        General: Normal range of motion.     Cervical back: Neck supple.  Skin:    General: Skin is warm and dry.  Neurological:     Mental Status: She is alert and oriented to person, place, and time.      UC Treatments / Results  Labs (all labs ordered are listed, but only abnormal results are displayed) Labs Reviewed  POC URINE PREG, ED  CERVICOVAGINAL ANCILLARY ONLY    EKG   Radiology No results found.  Procedures Procedures (including critical care time)  Medications Ordered in UC Medications - No data to display  Initial Impression / Assessment and Plan / UC Course  I have reviewed the triage vital signs and the nursing notes.  Pertinent labs & imaging results that were available during my care of the patient were reviewed by me and considered in my medical decision making (see chart for details).     Exam is suggestive of likely HSV outbreak as well as possible vaginitis.  Empirically treating for HSV with Valtrex and yeast with terconazole suppositories.  Vaginal swab pending.  Will call with results and provide further treatment as needed.  Recommended follow-up with OB/GYN for further evaluation and treatment of recurrent yeast.  Discussed strict return precautions. Patient verbalized understanding and is agreeable with plan.  Final Clinical Impressions(s) / UC Diagnoses   Final  diagnoses:  Vaginal discharge  HSV (herpes simplex virus) infection     Discharge Instructions     Begin terconazole suppositories to help with yeast, please continue  with Valtrex to treat HSV  Follow-up with OB/GYN for further evaluation/treatment of recurrent infections  We are testing you for Gonorrhea, Chlamydia, Trichomonas, Yeast and Bacterial Vaginosis. We will call you if anything is positive and let you know if you require any further treatment. Please inform partners of any positive results.   Please return if symptoms not improving with treatment, development of fever, nausea, vomiting, abdominal pain.     ED Prescriptions    Medication Sig Dispense Auth. Provider   valACYclovir (VALTREX) 1000 MG tablet Take 1 tablet (1,000 mg total) by mouth 2 (two) times daily. 30 tablet Kadyn Guild C, PA-C   terconazole (TERAZOL 3) 80 MG vaginal suppository Place 1 suppository (80 mg total) vaginally at bedtime for 3 days. 12 suppository Shaylie Eklund, [place] C, PA-C     PDMP not reviewed this encounter.   Lew Dawes, [place] 08/06/19 1512

## 2019-08-06 NOTE — Discharge Instructions (Signed)
Begin terconazole suppositories to help with yeast, please continue with Valtrex to treat HSV  Follow-up with OB/GYN for further evaluation/treatment of recurrent infections  We are testing you for Gonorrhea, Chlamydia, Trichomonas, Yeast and Bacterial Vaginosis. We will call you if anything is positive and let you know if you require any further treatment. Please inform partners of any positive results.   Please return if symptoms not improving with treatment, development of fever, nausea, vomiting, abdominal pain.

## 2019-08-06 NOTE — ED Triage Notes (Signed)
Pt here for vaginal irritation x 3 days with some white discharge

## 2019-08-06 NOTE — ED Notes (Signed)
Pt did not answer per front desk went to her car

## 2019-08-07 ENCOUNTER — Ambulatory Visit: Payer: Self-pay

## 2019-08-07 LAB — CERVICOVAGINAL ANCILLARY ONLY
Bacterial Vaginitis (gardnerella): NEGATIVE
Candida Glabrata: NEGATIVE
Candida Vaginitis: POSITIVE — AB
Chlamydia: NEGATIVE
Comment: NEGATIVE
Comment: NEGATIVE
Comment: NEGATIVE
Comment: NEGATIVE
Comment: NEGATIVE
Comment: NORMAL
Neisseria Gonorrhea: NEGATIVE
Trichomonas: NEGATIVE

## 2019-09-14 ENCOUNTER — Ambulatory Visit (HOSPITAL_COMMUNITY)
Admission: EM | Admit: 2019-09-14 | Discharge: 2019-09-14 | Discharge status: Against Medical Advice | Payer: BC Managed Care – PPO

## 2019-09-14 ENCOUNTER — Other Ambulatory Visit: Payer: Self-pay

## 2019-09-16 ENCOUNTER — Other Ambulatory Visit: Payer: Self-pay

## 2019-09-16 ENCOUNTER — Encounter: Payer: Self-pay | Admitting: Emergency Medicine

## 2019-09-16 ENCOUNTER — Ambulatory Visit
Admission: EM | Admit: 2019-09-16 | Discharge: 2019-09-16 | Disposition: A | Discharge status: Home / Self Care | Payer: BC Managed Care – PPO | Attending: Emergency Medicine | Admitting: Emergency Medicine

## 2019-09-16 DIAGNOSIS — J069 Acute upper respiratory infection, unspecified: Secondary | ICD-10-CM | POA: Diagnosis not present

## 2019-09-16 DIAGNOSIS — R05 Cough: Secondary | ICD-10-CM | POA: Insufficient documentation

## 2019-09-16 DIAGNOSIS — Z881 Allergy status to other antibiotic agents status: Secondary | ICD-10-CM | POA: Insufficient documentation

## 2019-09-16 DIAGNOSIS — Z20822 Contact with and (suspected) exposure to covid-19: Secondary | ICD-10-CM | POA: Insufficient documentation

## 2019-09-16 DIAGNOSIS — R519 Headache, unspecified: Secondary | ICD-10-CM | POA: Diagnosis present

## 2019-09-16 NOTE — ED Triage Notes (Signed)
Patient c/o nasal congestion and headache and dry cough since Labor Day. Patient denies fevers.

## 2019-09-16 NOTE — ED Provider Notes (Signed)
[facility]    CSN: [id number] Arrival date & time: 09/16/19  1354      History   Chief Complaint Chief Complaint  Patient presents with  . Nasal Congestion  . Headache    HPI Amber Good is a 29 y.o. female.   Patient presents with 6-day history of nasal congestion, sinus headache, nonproductive cough.  She denies fever, chills, sore throat, shortness of breath, vomiting, diarrhea, or other symptoms.  Treatment attempted at home with DayQuil and NyQuil.  The history is provided by the patient.    Past Medical History:  Diagnosis Date  . Chlamydia 2012  . Headache   . Ovarian cyst   . Preterm labor   . Round ligament pain 02/16/2017    Patient Active Problem List   Diagnosis Date Noted  . Round ligament pain 02/16/2017  . 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 Preterm delivery 10/13/2010  . Preterm labor 10/11/2010  . Back pain complicating pregnancy 10/06/2010    Past Surgical History:  Procedure Laterality Date  . DILATION AND CURETTAGE OF UTERUS    . DILATION AND EVACUATION N/A 09/14/2013   Procedure: DILATATION AND EVACUATION;  Surgeon: Michael Litter, MD;  Location: [facility];  Service: Gynecology;  Laterality: N/A;  . LIPOSUCTION AUOLOGOUS FAT TRANSFER TO BUTTOCKS      OB History    Gravida  3   Para  1   Term      Preterm  1   AB  1   Living  1     SAB  1   TAB      Ectopic      Multiple      Live Births  1            Home Medications    Prior to Admission medications   Medication Sig Start Date End Date Taking? Authorizing Provider  valACYclovir (VALTREX) 1000 MG tablet Take 1 tablet (1,000 mg total) by mouth 2 (two) times daily. 08/06/19   [place], Junius Creamer, PA-C    Family History Family History  Problem Relation Age of Onset  . Cancer Maternal Grandmother   . Diabetes Maternal Grandmother   . Heart disease Maternal Grandmother   . Liver disease Maternal Grandfather   . Cancer Maternal Aunt     . Cancer Paternal Aunt   . Cancer Paternal Grandmother     Social History Social History   Tobacco Use  . Smoking status: Never Smoker  . Smokeless tobacco: Never Used  Vaping Use  . Vaping Use: Never used  Substance Use Topics  . Alcohol use: Not Currently  . Drug use: No     Allergies   Latex and Cefaclor   Review of Systems Review of Systems  Constitutional: Negative for chills and fever.  HENT: Positive for congestion. Negative for ear pain and sore throat.   Eyes: Negative for pain and visual disturbance.  Respiratory: Positive for cough. Negative for shortness of breath.   Cardiovascular: Negative for chest pain and palpitations.  Gastrointestinal: Negative for abdominal pain, diarrhea and vomiting.  Genitourinary: Negative for dysuria and hematuria.  Musculoskeletal: Negative for arthralgias and back pain.  Skin: Negative for color change and rash.  Neurological: Positive for headaches. Negative for seizures and syncope.  All other systems reviewed and are negative.    Physical Exam Triage Vital Signs ED Triage Vitals  Enc Vitals Group     BP 09/16/19 1441 119/61  Pulse Rate 09/16/19 1441 60     Resp 09/16/19 1441 14     Temp 09/16/19 1441 98.4 F (36.9 C)     Temp Source 09/16/19 1441 Oral     SpO2 09/16/19 1441 100 %     Weight 09/16/19 1438 180 lb (81.6 kg)     Height 09/16/19 1438 5\' 3"  (1.6 m)     Head Circumference --      Peak Flow --      Pain Score 09/16/19 1438 0     Pain Loc --      Pain Edu? --      Excl. in [place]? --    No data found.  Updated Vital Signs BP 119/61 (BP Location: Right Arm)   Pulse 60   Temp 98.4 F (36.9 C) (Oral)   Resp 14   Ht 5\' 3"  (1.6 m)   Wt 180 lb (81.6 kg)   LMP 09/14/2019 (Exact Date)   SpO2 100%   BMI 31.89 kg/m   Visual Acuity Right Eye Distance:   Left Eye Distance:   Bilateral Distance:    Right Eye Near:   Left Eye Near:    Bilateral Near:     Physical Exam Vitals and nursing note  reviewed.  Constitutional:      General: She is not in acute distress.    Appearance: She is well-developed. She is not ill-appearing.  HENT:     Head: Normocephalic and atraumatic.     Right Ear: Tympanic membrane normal.     Left Ear: Tympanic membrane normal.     Nose: Nose normal.     Mouth/Throat:     Mouth: Mucous membranes are moist.     Pharynx: Oropharynx is clear.  Eyes:     Conjunctiva/sclera: Conjunctivae normal.  Cardiovascular:     Rate and Rhythm: Normal rate and regular rhythm.     Heart sounds: No murmur heard.   Pulmonary:     Effort: Pulmonary effort is normal. No respiratory distress.     Breath sounds: Normal breath sounds.  Abdominal:     Palpations: Abdomen is soft.     Tenderness: There is no abdominal tenderness. There is no guarding or rebound.  Musculoskeletal:     Cervical back: Neck supple.  Skin:    General: Skin is warm and dry.     Findings: No rash.  Neurological:     General: No focal deficit present.     Mental Status: She is alert and oriented to person, place, and time.     Gait: Gait normal.  Psychiatric:        Mood and Affect: Mood normal.        Behavior: Behavior normal.      UC Treatments / Results  Labs (all labs ordered are listed, but only abnormal results are displayed) Labs Reviewed  SARS CORONAVIRUS 2 (TAT 6-24 HRS)    EKG   Radiology No results found.  Procedures Procedures (including critical care time)  Medications Ordered in UC Medications - No data to display  Initial Impression / Assessment and Plan / UC Course  I have reviewed the triage vital signs and the nursing notes.  Pertinent labs & imaging results that were available during my care of the patient were reviewed by me and considered in my medical decision making (see chart for details).   Viral URI.  PCR COVID pending.  Instructed patient to self quarantine until the test result is back.  Discussed symptomatic treatment including Tylenol,  rest, hydration.  Instructed patient to go to the ED if she has acute worsening symptoms.  Patient agrees to plan of care.    Final Clinical Impressions(s) / UC Diagnoses   Final diagnoses:  Viral upper respiratory tract infection     Discharge Instructions     Your COVID test is pending.  You should self quarantine until the test result is back.    Take Tylenol as needed for fever or discomfort.  Rest and keep yourself hydrated.    Go to the emergency department if you develop acute worsening symptoms.        ED Prescriptions    None     PDMP not reviewed this encounter.   Mickie Bail, NP 09/16/19 1511

## 2019-09-16 NOTE — Discharge Instructions (Addendum)
Your COVID test is pending.  You should self quarantine until the test result is back.    Take Tylenol as needed for fever or discomfort.  Rest and keep yourself hydrated.    Go to the emergency department if you develop acute worsening symptoms.     

## 2019-09-17 LAB — SARS CORONAVIRUS 2 (TAT 6-24 HRS): SARS Coronavirus 2: NEGATIVE

## 2019-09-18 ENCOUNTER — Ambulatory Visit: Payer: Self-pay

## 2019-09-18 ENCOUNTER — Ambulatory Visit
Admission: EM | Admit: 2019-09-18 | Discharge: 2019-09-18 | Disposition: A | Discharge status: Home / Self Care | Payer: BC Managed Care – PPO

## 2020-10-01 ENCOUNTER — Other Ambulatory Visit: Payer: Self-pay

## 2020-10-01 ENCOUNTER — Encounter (HOSPITAL_BASED_OUTPATIENT_CLINIC_OR_DEPARTMENT_OTHER): Payer: Self-pay

## 2020-10-01 ENCOUNTER — Emergency Department (HOSPITAL_BASED_OUTPATIENT_CLINIC_OR_DEPARTMENT_OTHER)
Admission: EM | Admit: 2020-10-01 | Discharge: 2020-10-01 | Disposition: A | Discharge status: Home / Self Care | Payer: BC Managed Care – PPO | Attending: Emergency Medicine | Admitting: Emergency Medicine

## 2020-10-01 DIAGNOSIS — Z91040 Latex allergy status: Secondary | ICD-10-CM | POA: Insufficient documentation

## 2020-10-01 DIAGNOSIS — R059 Cough, unspecified: Secondary | ICD-10-CM | POA: Diagnosis present

## 2020-10-01 DIAGNOSIS — U071 COVID-19: Secondary | ICD-10-CM | POA: Diagnosis not present

## 2020-10-01 DIAGNOSIS — Z20822 Contact with and (suspected) exposure to covid-19: Secondary | ICD-10-CM

## 2020-10-01 LAB — RESP PANEL BY RT-PCR (FLU A&B, COVID) ARPGX2
Influenza A by PCR: NEGATIVE
Influenza B by PCR: NEGATIVE
SARS Coronavirus 2 by RT PCR: NEGATIVE

## 2020-10-01 MED ORDER — NAPROXEN 250 MG PO TABS
500.0000 mg | ORAL_TABLET | Freq: Once | ORAL | Status: AC
  Administered 2020-10-01: 500 mg via ORAL
  Filled 2020-10-01: qty 2

## 2020-10-01 NOTE — Discharge Instructions (Signed)
We suspect that you likely have COVID-19 although the initial test is negative.  Therefore, we recommend that you take Tylenol and ibuprofen for symptom control and quarantine for 5 days.  On day 6, as long as your symptoms are improving you can go back to work but you will need a mask on for 5 more days.  Return to the ER if your symptoms get worse.  Otherwise the treatment is supportive with ibuprofen and Tylenol for pain and fevers.  You can take over-the-counter Mucinex or other decongestant if having severe congestion and cough.

## 2020-10-01 NOTE — ED Notes (Signed)
This RN presented the AVS utilizing Teachback Method. Patient verbalizes understanding of Discharge Instructions. Opportunity for Questioning and Answers were provided. Patient Discharged from ED ambulatory to Home via SELF  

## 2020-10-01 NOTE — ED Triage Notes (Signed)
Pt  reports cough, body ache that started yesterday. No covid test recently

## 2020-10-01 NOTE — ED Provider Notes (Signed)
[facility] EMERGENCY DEPT Provider Note   CSN: [id number] Arrival date & time: 10/01/20  1853     History Chief Complaint  Patient presents with  . Cough  . Generalized Body Aches    Amber Good is a 30 y.o. female.  HPI    30 year old female comes in with chief complaint of cough, body aches, headaches.  She started feeling unwell yesterday.  Her son started getting sick a day prior to her.  She has had COVID-19 vaccination and a booster shot thereafter. She does not have any underlying lung disease history.  Past Medical History:  Diagnosis Date  . Chlamydia 2012  . Headache   . Ovarian cyst   . Preterm labor   . Round ligament pain 02/16/2017    Patient Active Problem List   Diagnosis Date Noted  . Round ligament pain 02/16/2017  . 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 Preterm delivery 10/13/2010  . Preterm labor 10/11/2010  . Back pain complicating pregnancy 10/06/2010    Past Surgical History:  Procedure Laterality Date  . DILATION AND CURETTAGE OF UTERUS    . DILATION AND EVACUATION N/A 09/14/2013   Procedure: DILATATION AND EVACUATION;  Surgeon: Michael Litter, MD;  Location: [facility];  Service: Gynecology;  Laterality: N/A;  . LIPOSUCTION AUOLOGOUS FAT TRANSFER TO BUTTOCKS       OB History     Gravida  3   Para  1   Term      Preterm  1   AB  1   Living  1      SAB  1   IAB      Ectopic      Multiple      Live Births  1           Family History  Problem Relation Age of Onset  . Cancer Maternal Grandmother   . Diabetes Maternal Grandmother   . Heart disease Maternal Grandmother   . Liver disease Maternal Grandfather   . Cancer Maternal Aunt   . Cancer Paternal Aunt   . Cancer Paternal Grandmother     Social History   Tobacco Use  . Smoking status: Never  . Smokeless tobacco: Never  Vaping Use  . Vaping Use: Never used  Substance Use Topics  . Alcohol use: Not Currently  . Drug  use: No    Home Medications Prior to Admission medications   Medication Sig Start Date End Date Taking? Authorizing Provider  valACYclovir (VALTREX) 1000 MG tablet Take 1 tablet (1,000 mg total) by mouth 2 (two) times daily. 08/06/19   Wieters, Hallie C, PA-C    Allergies    Latex and Cefaclor  Review of Systems   Review of Systems  Constitutional:  Positive for activity change and fatigue.  HENT:  Positive for congestion and sore throat.   Respiratory:  Negative for shortness of breath.   Cardiovascular:  Negative for chest pain.   Physical Exam Updated Vital Signs BP 120/83 (BP Location: Right Arm)   Pulse 72   Temp 98.5 F (36.9 C) (Oral)   Resp 18   Ht 5\' 3"  (1.6 m)   Wt 81.6 kg   LMP 09/22/2020 (Exact Date)   SpO2 100%   BMI 31.89 kg/m   Physical Exam Vitals and nursing note reviewed.  Constitutional:      Appearance: She is well-developed.  HENT:     Head: Atraumatic.     Mouth/Throat:  Pharynx: No oropharyngeal exudate or posterior oropharyngeal erythema.  Eyes:     Extraocular Movements: Extraocular movements intact.     Pupils: Pupils are equal, round, and reactive to light.  Cardiovascular:     Rate and Rhythm: Normal rate.  Pulmonary:     Effort: Pulmonary effort is normal.  Musculoskeletal:     Cervical back: Normal range of motion and neck supple.  Skin:    General: Skin is warm and dry.  Neurological:     Mental Status: She is alert and oriented to person, place, and time.    ED Results / Procedures / Treatments   Labs (all labs ordered are listed, but only abnormal results are displayed) Labs Reviewed  RESP PANEL BY RT-PCR (FLU A&B, COVID) ARPGX2  RESP PANEL BY RT-PCR (FLU A&B, COVID) ARPGX2    EKG None  Radiology No results found.  Procedures Procedures   Medications Ordered in ED Medications  naproxen (NAPROSYN) tablet 500 mg (has no administration in time range)    ED Course  I have reviewed the triage vital signs and  the nursing notes.  Pertinent labs & imaging results that were available during my care of the patient were reviewed by me and considered in my medical decision making (see chart for details).    MDM Rules/Calculators/A&P                           30 year old comes in with URI-like symptoms. Son is having similar symptoms and has tested positive for COVID-19.  Patient's COVID-19 test is negative, however, my suspicion is that it is a false negative.  Results discussed with her and we will proceed with presumption that she has COVID-19.  She is otherwise healthy, and outside of BMI of greater than 30 no other high risk features.  She is vaccinated and boosted, I do not think she is a great candidate for oral antivirals.  Patient agrees.  Strict ER return precautions discussed.  Final Clinical Impression(s) / ED Diagnoses Final diagnoses:  Suspected COVID-19 virus infection    Rx / DC Orders ED Discharge Orders     None        Derwood Kaplan, MD 10/01/20 2301

## 2020-10-02 LAB — RESP PANEL BY RT-PCR (FLU A&B, COVID) ARPGX2
Influenza A by PCR: NEGATIVE
Influenza B by PCR: NEGATIVE
SARS Coronavirus 2 by RT PCR: POSITIVE — AB

## 2020-10-02 NOTE — ED Notes (Signed)
This RN called this Patient regarding their most recent COVID-19 Test to inform them of their Results. Patient verbalizes understanding of Results and has no further Questions regarding AVS or Further Care.

## 2021-10-26 ENCOUNTER — Ambulatory Visit
Admission: EM | Admit: 2021-10-26 | Discharge: 2021-10-26 | Disposition: A | Discharge status: Home / Self Care | Payer: BC Managed Care – PPO | Attending: Internal Medicine | Admitting: Internal Medicine

## 2021-10-26 DIAGNOSIS — J069 Acute upper respiratory infection, unspecified: Secondary | ICD-10-CM

## 2021-10-26 MED ORDER — BENZONATATE 100 MG PO CAPS: 100.0000 mg | ORAL_CAPSULE | Freq: Three times a day (TID) | ORAL | 0 refills | Status: AC | PRN

## 2021-10-26 MED ORDER — GUAIFENESIN ER 600 MG PO TB12: 600.0000 mg | ORAL_TABLET | Freq: Two times a day (BID) | ORAL | 0 refills | Status: AC

## 2021-10-26 NOTE — Discharge Instructions (Addendum)
Maintain adequate hydration Please take medications as prescribed. Humidifier and vapor rub use will help with nasal congestion and cough This cough will resolve over the course of the next week to 10 days If you have worsening symptoms please return to urgent care to be reevaluated COVID or flu testing is not indicated given the duration of symptoms.

## 2021-10-26 NOTE — ED Triage Notes (Signed)
Pt c/o sore throat, ear aches, headaches, cough, progressing to hoarse voice onset ~ 1 week ago.

## 2021-10-27 NOTE — ED Provider Notes (Signed)
[facility]    CSN: [id number] Arrival date & time: 10/26/21  1825      History   Chief Complaint Chief Complaint  Patient presents with   Cough    HPI Amber Good is a 31 y.o. female.   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   Preterm delivery 10/13/2010   Preterm labor 10/11/2010   Back pain complicating pregnancy [id number]    Past Surgical History:  Procedure Laterality Date   DILATION AND CURETTAGE OF UTERUS     DILATION AND EVACUATION N/A 09/14/2013   Procedure: DILATATION AND EVACUATION;  Surgeon: Betsy Coder, MD;  Location: [facility];  Service: Gynecology;  Laterality: N/A;   LIPOSUCTION AUOLOGOUS FAT TRANSFER TO BUTTOCKS      OB History     Gravida  3   Para  1   Term      Preterm  1   AB  1   Living  1      SAB  1   IAB      Ectopic      Multiple      Live Births  1            Home Medications    Prior to Admission medications   Medication Sig Start Date End Date Taking? Authorizing Provider  benzonatate (TESSALON) 100 MG capsule Take 1 capsule (100 mg total) by mouth 3 (three) times daily as needed for cough. 10/26/21  Yes Gredmarie Delange, Myrene Galas, MD  guaiFENesin (MUCINEX) 600 MG 12 hr tablet Take 1 tablet (600 mg total) by mouth 2 (two) times daily for 10 days. 10/26/21 11/05/21 Yes Francy Mcilvaine, Myrene Galas, MD  valACYclovir (VALTREX) 1000 MG tablet Take 1 tablet (1,000 mg total) by mouth 2 (two) times daily. 08/06/19   Wieters, Elesa Hacker, PA-C    Family History Family History  Problem Relation Age of Onset   Cancer Maternal Grandmother    Diabetes Maternal Grandmother    Heart disease Maternal Grandmother    Liver disease Maternal Grandfather    Cancer Maternal Aunt    Cancer  Paternal Aunt    Cancer Paternal Grandmother     Social History Social History   Tobacco Use   Smoking status: Never   Smokeless tobacco: Never  Vaping Use   Vaping Use: Never used  Substance Use Topics   Alcohol use: Not Currently   Drug use: No     Allergies   Latex and Cefaclor   Review of Systems Review of Systems   Physical Exam Triage Vital Signs ED Triage Vitals [10/26/21 1913]  Enc Vitals Group     BP 113/76     Pulse Rate 70     Resp 18     Temp 98 F (36.7 C)     Temp Source Oral     SpO2 97 %     Weight      Height      Head Circumference      Peak Flow      Pain Score 8     Pain Loc      Pain Edu?      Excl. in [place]?  No data found.  Updated Vital Signs BP 113/76 (BP Location: Left Arm)   Pulse 70   Temp 98 F (36.7 C) (Oral)   Resp 18   SpO2 97%   Visual Acuity Right Eye Distance:   Left Eye Distance:   Bilateral Distance:    Right Eye Near:   Left Eye Near:    Bilateral Near:     Physical Exam   UC Treatments / Results  Labs (all labs ordered are listed, but only abnormal results are displayed) Labs Reviewed - No data to display  EKG   Radiology No results found.  Procedures Procedures (including critical care time)  Medications Ordered in UC Medications - No data to display  Initial Impression / Assessment and Plan / UC Course  I have reviewed the triage vital signs and the nursing notes.  Pertinent labs & imaging results that were available during my care of the patient were reviewed by me and considered in my medical decision making (see chart for details).     *** Final Clinical Impressions(s) / UC Diagnoses   Final diagnoses:  Viral URI with cough     Discharge Instructions      Maintain adequate hydration Please take medications as prescribed. Humidifier and vapor rub use will help with nasal congestion and cough This cough will resolve over the course of the next week to 10 days If you have  worsening symptoms please return to urgent care to be reevaluated COVID or flu testing is not indicated given the duration of symptoms.   ED Prescriptions     Medication Sig Dispense Auth. Provider   benzonatate (TESSALON) 100 MG capsule Take 1 capsule (100 mg total) by mouth 3 (three) times daily as needed for cough. 30 capsule Alcee Sipos, Myrene Galas, MD   guaiFENesin (MUCINEX) 600 MG 12 hr tablet Take 1 tablet (600 mg total) by mouth 2 (two) times daily for 10 days. 20 tablet Jennelle Pinkstaff, Myrene Galas, MD      PDMP not reviewed this encounter.

## 2022-01-25 ENCOUNTER — Other Ambulatory Visit: Payer: Self-pay | Admitting: Nurse Practitioner

## 2022-01-25 ENCOUNTER — Ambulatory Visit
Admission: RE | Admit: 2022-01-25 | Discharge: 2022-01-25 | Disposition: A | Discharge status: Home / Self Care | Payer: BC Managed Care – PPO | Source: Ambulatory Visit | Attending: Nurse Practitioner | Admitting: Nurse Practitioner

## 2022-01-25 DIAGNOSIS — Z021 Encounter for pre-employment examination: Secondary | ICD-10-CM

## 2022-04-26 ENCOUNTER — Ambulatory Visit
Admission: EM | Admit: 2022-04-26 | Discharge: 2022-04-26 | Disposition: A | Discharge status: Home / Self Care | Payer: BC Managed Care – PPO | Attending: Emergency Medicine | Admitting: Emergency Medicine

## 2022-04-26 DIAGNOSIS — J069 Acute upper respiratory infection, unspecified: Secondary | ICD-10-CM | POA: Diagnosis not present

## 2022-04-26 DIAGNOSIS — Z1152 Encounter for screening for COVID-19: Secondary | ICD-10-CM | POA: Diagnosis not present

## 2022-04-26 NOTE — ED Triage Notes (Signed)
Pt c/o sore throat, headache, ears ringing, "mucous down my throat" malaise   Onset ~ Thursday

## 2022-04-26 NOTE — ED Provider Notes (Signed)
[facility]    CSN: [id number] Arrival date & time: 04/26/22  1713      History   Chief Complaint Chief Complaint  Patient presents with   Cough    HPI Amber Good is a 32 y.o. female. cough for last week. Using theraflu to help cough but it didn't work well and now throat is sore and ears with pressure, HA and head pressure. No congestion, just lots of post nasal drainage. No fever or chills.  She got sick first, but her children are here with similar symptoms.  Not tested herself for COVID at home.  Denies nasal discharge, denies purulent sputum or nasal discharge.   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   Preterm delivery 10/13/2010   Preterm labor 10/11/2010   Back pain complicating pregnancy 10/06/2010    Past Surgical History:  Procedure Laterality Date   DILATION AND CURETTAGE OF UTERUS     DILATION AND EVACUATION N/A 09/14/2013   Procedure: DILATATION AND EVACUATION;  Surgeon: Michael Litter, MD;  Location: [facility];  Service: Gynecology;  Laterality: N/A;   LIPOSUCTION AUOLOGOUS FAT TRANSFER TO BUTTOCKS      OB History     Gravida  3   Para  1   Term      Preterm  1   AB  1   Living  1      SAB  1   IAB      Ectopic      Multiple      Live Births  1            Home Medications    Prior to Admission medications   Medication Sig Start Date End Date Taking? Authorizing Provider  benzonatate (TESSALON) 100 MG capsule Take 1 capsule (100 mg total) by mouth 3 (three) times daily as needed for cough. 10/26/21   Lamptey, Britta Mccreedy, MD  valACYclovir (VALTREX) 1000 MG tablet Take 1 tablet (1,000 mg total) by mouth 2 (two) times daily. 08/06/19   Wieters, Junius Creamer, PA-C    Family  History Family History  Problem Relation Age of Onset   Cancer Maternal Grandmother    Diabetes Maternal Grandmother    Heart disease Maternal Grandmother    Liver disease Maternal Grandfather    Cancer Maternal Aunt    Cancer Paternal Aunt    Cancer Paternal Grandmother     Social History Social History   Tobacco Use   Smoking status: Never   Smokeless tobacco: Never  Vaping Use   Vaping Use: Never used  Substance Use Topics   Alcohol use: Not Currently   Drug use: No     Allergies   Latex and Cefaclor   Review of Systems Review of Systems  Respiratory:  Positive for cough.      Physical Exam Triage Vital Signs ED Triage Vitals  Enc Vitals Group     BP 04/26/22 1849 121/82     Pulse Rate 04/26/22 1849 71     Resp 04/26/22 1849 18     Temp 04/26/22 1849 98.2 F (36.8 C)     Temp Source 04/26/22 1849 Oral  SpO2 04/26/22 1849 97 %     Weight --      Height --      Head Circumference --      Peak Flow --      Pain Score 04/26/22 1848 4     Pain Loc --      Pain Edu? --      Excl. in [place]? --    No data found.  Updated Vital Signs BP 121/82 (BP Location: Right Arm)   Pulse 71   Temp 98.2 F (36.8 C) (Oral)   Resp 18   SpO2 97%   Visual Acuity Right Eye Distance:   Left Eye Distance:   Bilateral Distance:    Right Eye Near:   Left Eye Near:    Bilateral Near:     Physical Exam Constitutional:      General: She is not in acute distress.    Appearance: Normal appearance. She is not ill-appearing.  HENT:     Right Ear: Tympanic membrane, ear canal and external ear normal.     Left Ear: Tympanic membrane, ear canal and external ear normal.     Nose: No congestion or rhinorrhea.     Mouth/Throat:     Mouth: Mucous membranes are moist.     Pharynx: Oropharynx is clear. No oropharyngeal exudate or posterior oropharyngeal erythema.  Cardiovascular:     Rate and Rhythm: Normal rate and regular rhythm.  Pulmonary:     Effort: Pulmonary effort  is normal.     Breath sounds: Normal breath sounds.  Lymphadenopathy:     Head:     Right side of head: No submandibular adenopathy.     Left side of head: No submandibular adenopathy.  Neurological:     Mental Status: She is alert.      UC Treatments / Results  Labs (all labs ordered are listed, but only abnormal results are displayed) Labs Reviewed - No data to display  EKG   Radiology No results found.  Procedures Procedures (including critical care time)  Medications Ordered in UC Medications - No data to display  Initial Impression / Assessment and Plan / UC Course  I have reviewed the triage vital signs and the nursing notes.  Pertinent labs & imaging results that were available during my care of the patient were reviewed by me and considered in my medical decision making (see chart for details).     Will test for COVID.  Reviewed supportive care measures for URI.  Patient declines offer of work note.  Final Clinical Impressions(s) / UC Diagnoses   Final diagnoses:  None   Discharge Instructions   None    ED Prescriptions   None    PDMP not reviewed this encounter.   Cathlyn Parsons, NP 04/26/22 [phone]

## 2022-04-26 NOTE — Discharge Instructions (Addendum)
Add Mucinex to the over the counter medicine you are using to manage her symptoms.  Try saline nasal spray or better yet, try using saline irrigation, such as with a neti pot, several times a day while you are sick. Many neti pots come with salt packets premeasured to use to make saline. If you use your own salt, make sure it is kosher salt or sea salt (don't use table salt as it has iodine in it and you don't need that in your nose). Use distilled water to make saline. If you mix your own saline using your own salt, the recipe is 1/4 teaspoon salt in 1 cup warm water. Using saline irrigation can help prevent and treat sinus infections.   Ok to keep using theraflu if needed.   You will get a call if test is positive, you will not get a call if test is negative but you can check results in MyChart if you have a MyChart account.

## 2022-04-27 LAB — SARS CORONAVIRUS 2 (TAT 6-24 HRS): SARS Coronavirus 2: NEGATIVE

## 2022-11-18 ENCOUNTER — Encounter (HOSPITAL_COMMUNITY): Payer: Self-pay | Admitting: Family Medicine

## 2022-11-18 ENCOUNTER — Ambulatory Visit (HOSPITAL_COMMUNITY)
Admission: EM | Admit: 2022-11-18 | Discharge: 2022-11-18 | Disposition: A | Discharge status: Home / Self Care | Payer: 59 | Attending: Family Medicine | Admitting: Family Medicine

## 2022-11-18 DIAGNOSIS — H66002 Acute suppurative otitis media without spontaneous rupture of ear drum, left ear: Secondary | ICD-10-CM

## 2022-11-18 DIAGNOSIS — J069 Acute upper respiratory infection, unspecified: Secondary | ICD-10-CM | POA: Diagnosis present

## 2022-11-18 DIAGNOSIS — J0110 Acute frontal sinusitis, unspecified: Secondary | ICD-10-CM | POA: Diagnosis present

## 2022-11-18 LAB — SARS CORONAVIRUS 2 (TAT 6-24 HRS): SARS Coronavirus 2: NEGATIVE

## 2022-11-18 LAB — POCT RAPID STREP A (OFFICE): Rapid Strep A Screen: NEGATIVE

## 2022-11-18 MED ORDER — AMOXICILLIN-POT CLAVULANATE 500-125 MG PO TABS: 1.0000 | ORAL_TABLET | Freq: Three times a day (TID) | ORAL | 0 refills | Status: AC

## 2022-11-18 MED ORDER — AMOXICILLIN-POT CLAVULANATE 500-125 MG PO TABS: 1.0000 | ORAL_TABLET | Freq: Three times a day (TID) | ORAL | 0 refills | Status: DC

## 2022-11-18 NOTE — ED Provider Notes (Signed)
[facility]    CSN: [id number] Arrival date & time: 11/18/22  0272      History   Chief Complaint Chief Complaint  Patient presents with   Cough   Nasal Congestion   Sore Throat   Headache    HPI Amber Good is a 32 y.o. female.   Amber Good is a 32 year old female who presents with 24 hours of congestion, cough, sinus pressure, postnasal drip, and sore throat.  Her daughter had symptoms of the same kind.  She believes her cough is coming from sinus drainage and has not produced any sputum.  She has not had any fevers, chills, neck stiffness, trouble breathing, wheezing, chest pain, nausea, vomiting, diarrhea, rashes.  DayQuil and NyQuil have given her some relief but she has not used anything else.  Her oral intake has been good.  She notes being told she had a rash when taking cefaclor as a child but has never had a reaction to antibiotics that she knows of.  The history is provided by the patient.    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   Preterm delivery 10/13/2010   Preterm labor 10/11/2010   Back pain complicating pregnancy 10/06/2010    Past Surgical History:  Procedure Laterality Date   DILATION AND CURETTAGE OF UTERUS     DILATION AND EVACUATION N/A 09/14/2013   Procedure: DILATATION AND EVACUATION;  Surgeon: Michael Litter, MD;  Location: [facility];  Service: Gynecology;  Laterality: N/A;   LIPOSUCTION AUOLOGOUS FAT TRANSFER TO BUTTOCKS      OB History     Gravida  3   Para  1   Term      Preterm  1   AB  1   Living  1      SAB  1   IAB      Ectopic      Multiple      Live Births  1            Home Medications    Prior to Admission medications   Medication Sig Start Date End  Date Taking? Authorizing Provider  amoxicillin-clavulanate (AUGMENTIN) 500-125 MG tablet Take 1 tablet by mouth every 8 (eight) hours. 11/18/22  Yes Ivor Messier, MD  benzonatate (TESSALON) 100 MG capsule Take 1 capsule (100 mg total) by mouth 3 (three) times daily as needed for cough. 10/26/21   Merrilee Jansky, MD  etonogestrel-ethinyl estradiol (NUVARING) 0.12-0.015 MG/24HR vaginal ring Place vaginally. 01/12/19   [provider]  valACYclovir (VALTREX) 1000 MG tablet Take 1 tablet (1,000 mg total) by mouth 2 (two) times daily. 08/06/19   Wieters, Junius Creamer, PA-C    Family History Family History  Problem Relation Age of Onset   Cancer Maternal Grandmother    Diabetes Maternal Grandmother    Heart disease Maternal Grandmother    Liver disease Maternal Grandfather    Cancer Maternal Aunt    Cancer Paternal Aunt    Cancer Paternal Grandmother     Social History Social History   Tobacco Use   Smoking status: Never   Smokeless tobacco: Never  Vaping Use   Vaping status: Never Used  Substance Use  Topics   Alcohol use: Not Currently   Drug use: No     Allergies   Latex and Cefaclor   Review of Systems Review of Systems  Constitutional:  Negative for activity change, appetite change, chills, diaphoresis, fatigue and fever.  HENT:  Positive for congestion, postnasal drip, rhinorrhea, sinus pressure and sore throat. Negative for ear pain, facial swelling, sinus pain, sneezing, tinnitus, trouble swallowing and voice change.   Eyes:  Negative for photophobia.  Respiratory:  Positive for cough (Without sputum production). Negative for chest tightness, shortness of breath and wheezing.   Cardiovascular:  Negative for chest pain.  Gastrointestinal:  Negative for abdominal pain, diarrhea, nausea and vomiting.  Musculoskeletal:  Negative for arthralgias, myalgias, neck pain and neck stiffness.  Skin:  Negative for rash.  Neurological:  Negative for dizziness, weakness,  light-headedness and numbness.     Physical Exam Triage Vital Signs ED Triage Vitals  Encounter Vitals Group     BP      Systolic BP Percentile      Diastolic BP Percentile      Pulse      Resp      Temp      Temp src      SpO2      Weight      Height      Head Circumference      Peak Flow      Pain Score      Pain Loc      Pain Education      Exclude from Growth Chart    No data found.  Updated Vital Signs BP 112/75 (BP Location: Right Arm)   Pulse (!) 57   Temp 97.9 F (36.6 C) (Oral)   Resp 16   LMP 10/22/2022 (Exact Date)   SpO2 97%   Visual Acuity Right Eye Distance:   Left Eye Distance:   Bilateral Distance:    Right Eye Near:   Left Eye Near:    Bilateral Near:     Physical Exam Vitals reviewed.  Constitutional:      General: She is not in acute distress.    Appearance: Normal appearance. She is normal weight. She is not ill-appearing, toxic-appearing or diaphoretic.  HENT:     Head: Normocephalic and atraumatic.     Right Ear: Tympanic membrane normal.     Ears:     Comments: Left TM slightly bulging with clear effusion.  There is trace purulent line inferiorly.  No erythema and external auditory canal is normal.  Manipulation of the pinna does not produce pain    Nose: Congestion present. No rhinorrhea.     Mouth/Throat:     Mouth: Mucous membranes are moist.     Comments: No tonsillar edema.  There is some purulent postnasal drip.  No cobblestoning.  Mild erythema present.  No ulceration in all structures are midline. Eyes:     General:        Right eye: No discharge.        Left eye: No discharge.     Extraocular Movements: Extraocular movements intact.     Conjunctiva/sclera: Conjunctivae normal.     Pupils: Pupils are equal, round, and reactive to light.  Cardiovascular:     Rate and Rhythm: Normal rate and regular rhythm.     Pulses: Normal pulses.     Heart sounds: No murmur heard.    No gallop.  Pulmonary:     Effort: Pulmonary  effort is  normal.     Breath sounds: Normal breath sounds. No wheezing or rales.  Musculoskeletal:     Cervical back: Normal range of motion and neck supple. No rigidity or tenderness.  Skin:    General: Skin is warm and dry.     Capillary Refill: Capillary refill takes less than 2 seconds.     Findings: No rash.  Neurological:     Mental Status: She is alert.      UC Treatments / Results  Labs (all labs ordered are listed, but only abnormal results are displayed) Labs Reviewed  SARS CORONAVIRUS 2 (TAT 6-24 HRS)  POCT RAPID STREP A (OFFICE)    EKG   Radiology No results found.  Procedures Procedures (including critical care time)  Medications Ordered in UC Medications - No data to display  Initial Impression / Assessment and Plan / UC Course  I have reviewed the triage vital signs and the nursing notes.  Pertinent labs & imaging results that were available during my care of the patient were reviewed by me and considered in my medical decision making (see chart for details).     Sinusitis and left otitis media secondary to viral upper respiratory infection -Given patient's sore throat and similar symptoms from her daughter we will do a rapid strep a. - COVID pending - Fortunately the patient has the next 4 days off. - We discussed symptomatic management including Flonase, nasal saline, Mucinex, throat lozenges, and maintaining good oral hydration. -I will send in a prescription for Augmentin to the pharmacy.  If the patient is still having symptoms at day 6 or having worsening ear pain/sinus pain with purulent discharge, she can pick up the antibiotics and take until complete. - We discussed taking antibiotics with food, not using alcohol, and finishing the entire course.  If she develops a rash, itching, other allergic symptoms she should stop the antibiotic and call. - The patient voiced understanding and agreement with plan.   Final Clinical Impressions(s) / UC  Diagnoses   Final diagnoses:  Viral upper respiratory tract infection  Acute non-recurrent frontal sinusitis  Non-recurrent acute suppurative otitis media of left ear without spontaneous rupture of tympanic membrane     Discharge Instructions      You have an upper respiratory infection. Most cases are due to a virus and do not require antibiotics for treatment.  Make sure to continue good oral hydration.  You can take tylenol for fever as needed Start Flonase daily for the next week and then as needed. You can use nasal saline spray multiple times daily as well.  You can use a daily antihistamine or guaifenesin as an expectorant but you need to be well hydrated for these medications to work.  Get adequate rest for recovery Maintain distance from others and wear a mask in public areas to avoid spread  If you start to experience shortness of breath, fevers that don't respond to medication, confusion, profound neck stiffness, or fainting, return to the urgent care or ED.  If you are having continuously worsening symptoms including ear or sinus pain, or your symptoms do not resolve by day 6-7 you can pick up the antibiotics sent to the pharmacy. - Ensure to complete the entire course, take with food, and start a probiotic. - If you develop any rash, itching, scratchy throat stop the antibiotics immediately and call.     ED Prescriptions     Medication Sig Dispense Auth. Provider   amoxicillin-clavulanate (AUGMENTIN) 500-125 MG tablet  Take 1 tablet by mouth every 8 (eight) hours. 21 tablet Ivor Messier, MD      PDMP not reviewed this encounter.   Ivor Messier, MD 11/18/22 1020

## 2022-11-18 NOTE — Discharge Instructions (Addendum)
You have an upper respiratory infection. Most cases are due to a virus and do not require antibiotics for treatment.  Make sure to continue good oral hydration.  You can take tylenol for fever as needed Start Flonase daily for the next week and then as needed. You can use nasal saline spray multiple times daily as well.  You can use a daily antihistamine or guaifenesin as an expectorant but you need to be well hydrated for these medications to work.  Get adequate rest for recovery Maintain distance from others and wear a mask in public areas to avoid spread  If you start to experience shortness of breath, fevers that don't respond to medication, confusion, profound neck stiffness, or fainting, return to the urgent care or ED.  If you are having continuously worsening symptoms including ear or sinus pain, or your symptoms do not resolve by day 6-7 you can pick up the antibiotics sent to the pharmacy. - Ensure to complete the entire course, take with food, and start a probiotic. - If you develop any rash, itching, scratchy throat stop the antibiotics immediately and call.

## 2022-11-18 NOTE — ED Triage Notes (Signed)
Pt states yesterday she started with congestion, cough, sore throat due to cough, headache. She has been taking dayquila and nyquil.

## 2023-05-11 ENCOUNTER — Ambulatory Visit (HOSPITAL_COMMUNITY): Payer: Self-pay

## 2023-09-28 ENCOUNTER — Encounter: Payer: Self-pay | Admitting: Emergency Medicine

## 2023-09-28 ENCOUNTER — Ambulatory Visit: Admission: EM | Admit: 2023-09-28 | Discharge: 2023-09-28 | Disposition: A | Discharge status: Home / Self Care

## 2023-09-28 DIAGNOSIS — J029 Acute pharyngitis, unspecified: Secondary | ICD-10-CM | POA: Diagnosis not present

## 2023-09-28 DIAGNOSIS — R051 Acute cough: Secondary | ICD-10-CM | POA: Diagnosis not present

## 2023-09-28 DIAGNOSIS — B349 Viral infection, unspecified: Secondary | ICD-10-CM

## 2023-09-28 LAB — POCT RAPID STREP A (OFFICE): Rapid Strep A Screen: NEGATIVE

## 2023-09-28 LAB — POC COVID19/FLU A&B COMBO
Covid Antigen, POC: NEGATIVE
Influenza A Antigen, POC: NEGATIVE
Influenza B Antigen, POC: NEGATIVE

## 2023-09-28 NOTE — ED Provider Notes (Signed)
 [facility]    CSN: [id number] Arrival date & time: 09/28/23  9191      History   Chief Complaint Chief Complaint  Patient presents with   Sore Throat   Cough   Nasal Congestion   Headache    HPI Amber Good is a 33 y.o. female.  Patient presents with 3-day history of headache, fatigue, congestion, runny nose, postnasal drip, cough, nausea.  No fever, shortness of breath, vomiting, diarrhea.  Patient took Mucinex  at 0300 this morning.  The history is provided by the patient and medical records.    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   Preterm delivery 10/13/2010   Preterm labor 10/11/2010   Back pain complicating pregnancy 10/06/2010    Past Surgical History:  Procedure Laterality Date   DILATION AND CURETTAGE OF UTERUS     DILATION AND EVACUATION N/A 09/14/2013   Procedure: DILATATION AND EVACUATION;  Surgeon: Ovid DELENA All, MD;  Location: [facility];  Service: Gynecology;  Laterality: N/A;   LIPOSUCTION AUOLOGOUS FAT TRANSFER TO BUTTOCKS      OB History     Gravida  3   Para  1   Term      Preterm  1   AB  1   Living  1      SAB  1   IAB      Ectopic      Multiple      Live Births  1            Home Medications    Prior to Admission medications   Medication Sig Start Date End Date Taking? Authorizing Provider  spironolactone (ALDACTONE) 50 MG tablet Take 50 mg by mouth 2 (two) times daily. 09/08/23  Yes [provider]  amoxicillin -clavulanate (AUGMENTIN ) 500-125 MG tablet Take 1 tablet by mouth every 8 (eight) hours. 11/18/22   Janet Lonni BRAVO, MD  benzonatate  (TESSALON ) 100 MG capsule Take 1 capsule (100 mg total) by mouth 3 (three) times daily as needed for cough.  10/26/21   Lamptey, Aleene KIDD, MD  D3-50 1.25 MG (50000 UT) capsule Take 50,000 Units by mouth once a week.    [provider]  etonogestrel-ethinyl estradiol (NUVARING) 0.12-0.015 MG/24HR vaginal ring Place vaginally. 01/12/19   [provider]  valACYclovir  (VALTREX ) 1000 MG tablet Take 1 tablet (1,000 mg total) by mouth 2 (two) times daily. 08/06/19   Wieters, Hallie C, PA-C    Family History Family History  Problem Relation Age of Onset   Cancer Maternal Grandmother    Diabetes Maternal Grandmother    Heart disease Maternal Grandmother    Liver disease Maternal Grandfather    Cancer Maternal Aunt    Cancer Paternal Aunt    Cancer Paternal Grandmother     Social History Social History   Tobacco Use   Smoking status: Never   Smokeless tobacco: Never  Vaping Use   Vaping status: Never Used  Substance Use Topics   Alcohol use: Not Currently   Drug use: No     Allergies   Latex and Cefaclor   Review of Systems Review of Systems  Constitutional:  Positive for fatigue. Negative for chills  and fever.  HENT:  Positive for congestion, postnasal drip, rhinorrhea and sore throat. Negative for ear pain.   Respiratory:  Positive for cough. Negative for shortness of breath.   Gastrointestinal:  Positive for nausea. Negative for diarrhea and vomiting.     Physical Exam Triage Vital Signs ED Triage Vitals  Encounter Vitals Group     BP 09/28/23 0822 106/76     Girls Systolic BP Percentile --      Girls Diastolic BP Percentile --      Boys Systolic BP Percentile --      Boys Diastolic BP Percentile --      Pulse Rate 09/28/23 0822 (!) 59     Resp 09/28/23 0822 18     Temp 09/28/23 0822 98.4 F (36.9 C)     Temp src --      SpO2 09/28/23 0822 98 %     Weight --      Height --      Head Circumference --      Peak Flow --      Pain Score 09/28/23 0826 8     Pain Loc --      Pain Education --      Exclude from Growth Chart --    No data  found.  Updated Vital Signs BP 106/76   Pulse (!) 59   Temp 98.4 F (36.9 C)   Resp 18   LMP 09/17/2023 (Exact Date)   SpO2 98%   Visual Acuity Right Eye Distance:   Left Eye Distance:   Bilateral Distance:    Right Eye Near:   Left Eye Near:    Bilateral Near:     Physical Exam Constitutional:      General: She is not in acute distress. HENT:     Right Ear: Tympanic membrane normal.     Left Ear: Tympanic membrane normal.     Nose: Congestion and rhinorrhea present.     Mouth/Throat:     Mouth: Mucous membranes are moist.     Pharynx: Oropharynx is clear.  Cardiovascular:     Rate and Rhythm: Normal rate and regular rhythm.     Heart sounds: Normal heart sounds.  Pulmonary:     Effort: Pulmonary effort is normal. No respiratory distress.     Breath sounds: Normal breath sounds.  Neurological:     Mental Status: She is alert.      UC Treatments / Results  Labs (all labs ordered are listed, but only abnormal results are displayed) Labs Reviewed  POCT RAPID STREP A (OFFICE)  POC COVID19/FLU A&B COMBO    EKG   Radiology No results found.  Procedures Procedures (including critical care time)  Medications Ordered in UC Medications - No data to display  Initial Impression / Assessment and Plan / UC Course  I have reviewed the triage vital signs and the nursing notes.  Pertinent labs & imaging results that were available during my care of the patient were reviewed by me and considered in my medical decision making (see chart for details).    Viral illness, sore throat, cough.  Afebrile and vital signs are stable.  Lungs are clear and O2 sat is 98% on room air.  Rapid strep negative.  Rapid COVID and flu negative.  Discussed symptomatic treatment including Tylenol  or ibuprofen  as needed for fever or discomfort, plain Mucinex  as needed for congestion, rest, hydration.  Instructed patient to follow-up with her PCP if not improving.  ED  precautions given.   Patient agrees to plan of care.   Final Clinical Impressions(s) / UC Diagnoses   Final diagnoses:  Sore throat  Acute cough  Viral illness     Discharge Instructions      The COVID and flu tests are negative.  The strep test is negative.  Take Tylenol  or ibuprofen  as needed for fever or discomfort.  Take plain Mucinex  as needed for congestion.  Rest and keep yourself hydrated.    Follow-up with your primary care provider if your symptoms are not improving.         ED Prescriptions   None    PDMP not reviewed this encounter.   Corlis Burnard DEL, NP 09/28/23 [phone]

## 2023-09-28 NOTE — Discharge Instructions (Addendum)
 The COVID and flu tests are negative.  The strep test is negative.  Take Tylenol  or ibuprofen  as needed for fever or discomfort.  Take plain Mucinex  as needed for congestion.  Rest and keep yourself hydrated.    Follow-up with your primary care provider if your symptoms are not improving.

## 2023-09-28 NOTE — ED Triage Notes (Signed)
 Patient reports nasal congestion, headache,nausea, fatigue, dry cough and sore throat x 3 days. Patient has been taking Mucinex , Dayquil/Nyquil and Tylenol  cold and sinus with no relief. Rates headache 8/10 and rates sore throat 8/10.

## 2024-01-21 ENCOUNTER — Emergency Department (HOSPITAL_COMMUNITY)
Admission: EM | Admit: 2024-01-21 | Discharge: 2024-01-21 | Disposition: A | Discharge status: Home / Self Care | Attending: Emergency Medicine | Admitting: Emergency Medicine

## 2024-01-21 ENCOUNTER — Encounter (HOSPITAL_COMMUNITY): Payer: Self-pay

## 2024-01-21 ENCOUNTER — Other Ambulatory Visit: Payer: Self-pay

## 2024-01-21 ENCOUNTER — Emergency Department (HOSPITAL_COMMUNITY)

## 2024-01-21 DIAGNOSIS — R102 Pelvic and perineal pain unspecified side: Secondary | ICD-10-CM

## 2024-01-21 DIAGNOSIS — D259 Leiomyoma of uterus, unspecified: Secondary | ICD-10-CM | POA: Insufficient documentation

## 2024-01-21 DIAGNOSIS — Z91040 Latex allergy status: Secondary | ICD-10-CM | POA: Diagnosis not present

## 2024-01-21 DIAGNOSIS — R109 Unspecified abdominal pain: Secondary | ICD-10-CM | POA: Diagnosis present

## 2024-01-21 LAB — LIPASE, BLOOD: Lipase: 22 U/L (ref 11–51)

## 2024-01-21 LAB — COMPREHENSIVE METABOLIC PANEL WITH GFR
ALT: 12 U/L (ref 0–44)
AST: 19 U/L (ref 15–41)
Albumin: 4.4 g/dL (ref 3.5–5.0)
Alkaline Phosphatase: 120 U/L (ref 38–126)
Anion gap: 9 (ref 5–15)
BUN: 10 mg/dL (ref 6–20)
CO2: 23 mmol/L (ref 22–32)
Calcium: 9.5 mg/dL (ref 8.9–10.3)
Chloride: 104 mmol/L (ref 98–111)
Creatinine, Ser: 0.89 mg/dL (ref 0.44–1.00)
GFR, Estimated: >60 mL/min
Glucose, Bld: 90 mg/dL (ref 70–99)
Potassium: 4.5 mmol/L (ref 3.5–5.1)
Sodium: 137 mmol/L (ref 135–145)
Total Bilirubin: 0.6 mg/dL (ref 0.0–1.2)
Total Protein: 7.5 g/dL (ref 6.5–8.1)

## 2024-01-21 LAB — URINALYSIS, ROUTINE W REFLEX MICROSCOPIC
Bilirubin Urine: NEGATIVE
Glucose, UA: NEGATIVE mg/dL
Hgb urine dipstick: NEGATIVE
Ketones, ur: NEGATIVE mg/dL
Leukocytes,Ua: NEGATIVE
Nitrite: NEGATIVE
Protein, ur: NEGATIVE mg/dL
Specific Gravity, Urine: 1.025 (ref 1.005–1.030)
pH: 7 (ref 5.0–8.0)

## 2024-01-21 LAB — CBC
HCT: 37.7 % (ref 36.0–46.0)
Hemoglobin: 12.1 g/dL (ref 12.0–15.0)
MCH: 28.3 pg (ref 26.0–34.0)
MCHC: 32.1 g/dL (ref 30.0–36.0)
MCV: 88.1 fL (ref 80.0–100.0)
Platelets: 238 K/uL (ref 150–400)
RBC: 4.28 MIL/uL (ref 3.87–5.11)
RDW: 12.8 % (ref 11.5–15.5)
WBC: 6 K/uL (ref 4.0–10.5)
nRBC: 0 % (ref 0.0–0.2)

## 2024-01-21 LAB — HCG, SERUM, QUALITATIVE: Preg, Serum: NEGATIVE

## 2024-01-21 MED ORDER — KETOROLAC TROMETHAMINE 15 MG/ML IJ SOLN
15.0000 mg | Freq: Once | INTRAMUSCULAR | Status: AC
  Administered 2024-01-21: 15 mg via INTRAVENOUS
  Filled 2024-01-21: qty 1

## 2024-01-21 MED ORDER — NAPROXEN 500 MG PO TABS: 500.0000 mg | ORAL_TABLET | Freq: Two times a day (BID) | ORAL | 0 refills | Status: AC

## 2024-01-21 NOTE — ED Triage Notes (Signed)
 Pt c.o lower abd pain and pelvic pain x 3 days, pain radiates to her back and legs at times. Pt is supposed to start her menstrual cycle soon but doesn't normally feel this bad before her cycles.  Pt has a known large right uterine fibroid.  Pt taking ibuprofen  without relief. Pt also taking a new hormone therapy that was prescribed by her OBGYN that she started yesterday to help with her symptoms. Pt denies current bleeding, or abnormal discharge.

## 2024-01-21 NOTE — ED Notes (Signed)
 Patient transported to Ultrasound

## 2024-01-21 NOTE — ED Provider Notes (Signed)
 " [place] [facility] Provider Note   CSN: [id number] Arrival date & time: 01/21/24  9056     Patient presents with: Abdominal Pain and Pelvic Pain   Amber Good is a 34 y.o. female.  {Add pertinent medical, surgical, social history, OB history to HPI:1555} 34 year old female presents to the emergency department for evaluation of pelvic pain x 3 days.  She describes a cramping sensation which has been fairly constant and waxing and waning in severity.  She has taken Aleve  for symptoms without relief.  She does get some mild improvement with application of topical heating pads.  She is supposed to start her menstrual cycle in 1 to 2 days.  Her last menstrual period was on 12/22/2023.  Reports a history of a uterine fibroid diagnosed on pelvic ultrasound 3 weeks ago.  She has been prescribed birth control by her doctor, but has not yet started this.  Does have a history of menorrhagia.  No associated fever, vomiting, bowel changes, urinary symptoms, vaginal discharge.  She denies concern for STDs.  States that she was last sexually active 3 years ago.  She does have a history of genital herpes, but has not had a flare of this in several years and denies any lesions currently.  The history is provided by the patient. No language interpreter was used.  Abdominal Pain Pelvic Pain Associated symptoms include abdominal pain.       Prior to Admission medications  Medication Sig Start Date End Date Taking? Authorizing Provider  amoxicillin -clavulanate (AUGMENTIN ) 500-125 MG tablet Take 1 tablet by mouth every 8 (eight) hours. 11/18/22   Janet Lonni BRAVO, MD  benzonatate  (TESSALON ) 100 MG capsule Take 1 capsule (100 mg total) by mouth 3 (three) times daily as needed for cough. 10/26/21   Lamptey, Aleene KIDD, MD  D3-50 1.25 MG (50000 UT) capsule Take 50,000 Units by mouth once a week.    [provider]  etonogestrel-ethinyl estradiol  (NUVARING) 0.12-0.015 MG/24HR vaginal ring Place vaginally. 01/12/19   [provider]  spironolactone (ALDACTONE) 50 MG tablet Take 50 mg by mouth 2 (two) times daily. 09/08/23   [provider]  valACYclovir  (VALTREX ) 1000 MG tablet Take 1 tablet (1,000 mg total) by mouth 2 (two) times daily. 08/06/19   Wieters, Hallie C, PA-C    Allergies: Latex and Cefaclor    Review of Systems  Gastrointestinal:  Positive for abdominal pain.  Genitourinary:  Positive for pelvic pain.  Ten systems reviewed and are negative for acute change, except as noted in the HPI.    Updated Vital Signs BP 130/76   Pulse 71   Temp 98.4 F (36.9 C)   Resp 16   Ht 5' 3 (1.6 m)   Wt 89.4 kg   LMP 12/22/2023 (Approximate)   SpO2 98%   BMI 34.90 kg/m   Physical Exam Vitals and nursing note reviewed.  Constitutional:      General: She is not in acute distress.    Appearance: She is well-developed. She is not diaphoretic.     Comments: Nontoxic appearing and in NAD  HENT:     Head: Normocephalic and atraumatic.  Eyes:     General: No scleral icterus.    Conjunctiva/sclera: Conjunctivae normal.  Pulmonary:     Effort: Pulmonary effort is normal. No respiratory distress.     Comments: Respirations even and unlabored Abdominal:     Comments: Abdomen soft, obese.  There is tenderness to palpation  in the suprapubic abdomen as well as the right lower quadrant.  No involuntary guarding or peritoneal signs.  Genitourinary:    Comments: Deferred  Musculoskeletal:        General: Normal range of motion.     Cervical back: Normal range of motion.  Skin:    General: Skin is warm and dry.     Coloration: Skin is not pale.     Findings: No erythema or rash.  Neurological:     Mental Status: She is alert and oriented to person, place, and time.  Psychiatric:        Behavior: Behavior normal.     (all labs ordered are listed, but only abnormal results are displayed) Labs Reviewed  URINALYSIS,  ROUTINE W REFLEX MICROSCOPIC - Abnormal; Notable for the following components:      Result Value   APPearance HAZY (*)    All other components within normal limits  LIPASE, BLOOD  COMPREHENSIVE METABOLIC PANEL WITH GFR  CBC  HCG, SERUM, QUALITATIVE    EKG: None  Radiology: No results found.  {Document cardiac monitor, telemetry assessment procedure when appropriate:32947} Procedures   Medications Ordered in the ED  ketorolac  (TORADOL ) 15 MG/ML injection 15 mg (15 mg Intravenous Given 01/21/24 1149)      {Click here for ABCD2, HEART and other calculators REFRESH Note before signing:1}                              Medical Decision Making Amount and/or Complexity of Data Reviewed Labs: ordered. Radiology: ordered.  Risk Prescription drug management.   ***  {Document critical care time when appropriate  Document review of labs and clinical decision tools ie CHADS2VASC2, etc  Document your independent review of radiology images and any outside records  Document your discussion with family members, caretakers and with consultants  Document social determinants of health affecting pt's care  Document your decision making why or why not admission, treatments were needed:32947:::1}   Final diagnoses:  None    ED Discharge Orders     None        "

## 2024-01-21 NOTE — Discharge Instructions (Signed)
 We recommend naproxen  as prescribed for management of pain.  Continue follow-up with your OB/GYN.  You may return for new or concerning symptoms.
